# Patient Record
Sex: Male | Born: 1990
Health system: Southern US, Community
[De-identification: ages and names within clinical notes are randomized; demographics above are authoritative.]

## PROBLEM LIST (undated history)

## (undated) ENCOUNTER — Emergency Department (HOSPITAL_COMMUNITY): Payer: Self-pay | Source: Home / Self Care

## (undated) DIAGNOSIS — R569 Unspecified convulsions: Secondary | ICD-10-CM

## (undated) DIAGNOSIS — K259 Gastric ulcer, unspecified as acute or chronic, without hemorrhage or perforation: Secondary | ICD-10-CM

---
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
## 2017-08-07 ENCOUNTER — Encounter (HOSPITAL_BASED_OUTPATIENT_CLINIC_OR_DEPARTMENT_OTHER): Payer: Self-pay

## 2017-08-07 ENCOUNTER — Other Ambulatory Visit: Payer: Self-pay

## 2017-08-07 ENCOUNTER — Emergency Department (HOSPITAL_BASED_OUTPATIENT_CLINIC_OR_DEPARTMENT_OTHER)
Admission: EM | Admit: 2017-08-07 | Discharge: 2017-08-07 | Disposition: A | Discharge status: Home / Self Care | Payer: Self-pay | Attending: Emergency Medicine | Admitting: Emergency Medicine

## 2017-08-07 DIAGNOSIS — F17210 Nicotine dependence, cigarettes, uncomplicated: Secondary | ICD-10-CM | POA: Insufficient documentation

## 2017-08-07 DIAGNOSIS — L0291 Cutaneous abscess, unspecified: Secondary | ICD-10-CM

## 2017-08-07 DIAGNOSIS — K610 Anal abscess: Secondary | ICD-10-CM | POA: Insufficient documentation

## 2017-08-07 MED ORDER — TRAMADOL HCL 50 MG PO TABS
50.0000 mg | ORAL_TABLET | Freq: Once | ORAL | Status: AC
  Administered 2017-08-07: 50 mg via ORAL
  Filled 2017-08-07: qty 1

## 2017-08-07 MED ORDER — SULFAMETHOXAZOLE-TRIMETHOPRIM 800-160 MG PO TABS: 1.0000 | ORAL_TABLET | Freq: Two times a day (BID) | ORAL | 0 refills | Status: DC

## 2017-08-07 MED ORDER — DOXYCYCLINE HYCLATE 100 MG PO CAPS: 100.0000 mg | ORAL_CAPSULE | Freq: Two times a day (BID) | ORAL | 0 refills | Status: DC

## 2017-08-07 MED ORDER — LIDOCAINE-EPINEPHRINE (PF) 2 %-1:200000 IJ SOLN
10.0000 mL | Freq: Once | INTRAMUSCULAR | Status: AC
  Administered 2017-08-07: 10 mL
  Filled 2017-08-07 (×2): qty 10

## 2017-08-07 MED ORDER — TRAMADOL HCL 50 MG PO TABS: 50.0000 mg | ORAL_TABLET | Freq: Four times a day (QID) | ORAL | 0 refills | Status: DC | PRN

## 2017-08-07 NOTE — ED Triage Notes (Signed)
C/o right buttock abscess x 1 week-NAD-steady gait

## 2017-08-07 NOTE — Discharge Instructions (Signed)
Please read and follow all provided instructions.  You were seen here today for an Abscess . For this, an incision and drainage (aka an I&D) to the affected area was done today. An I&D is a surgical procedure to open and drain a fluid-filled sac that may be filled with pus, mucus, or blood. Examples of fluid-filled sacs that may need surgical drainage include cysts, skin infections (abscesses), and red lumps that develop from a ruptured cyst or a small abscess (boils).  Home instructions  1. Medications: Bactrim. Please take all of your antibiotics until finished!   You may develop abdominal discomfort or diarrhea from the antibiotic.  You may help offset this with probiotics which you can buy or get in yogurt. Do not eat or take the probiotics until 2 hours after your antibiotic. Do not take your medicine if develop an itchy rash, swelling in your mouth or lips, or difficulty breathing.   2. Treatment: Keep wound clean and dry. Apply warm compresses to the area throughout the day. It will continue to drain over the follow days.   3. For breakthrough pain you may take Ultram. Do not drink alcohol drive or operate heavy machinery when taking. You are being provided a prescription for opiates (also known as narcotics) for pain control on an ?as needed? basis.  Opiates can be addictive and should only be used when absolutely necessary for pain control when other alternatives do not work.  We recommend you only use them for the recommended amount of time and only as prescribed.  Please do not take with other sedative medications or alcohol.  Please do not drive, operate machinery, or make important decisions while taking opiates.  Please note that these medications can be addictive and have high abuse potential.  Please keep these medications locked away from children, teenagers or any family members with history of substance abuse. Additionally, these medications may cause constipation - take over the counter  stool softeners or add fiber to your diet to treat this (Metamucil, Psyllium Fiber, Colace, Miralax) Further refills will need to be obtained from your primary care doctor and will not be prescribed through the Emergency Department. You will test positive on most drug tests while taking this medication.   Follow Up:  [facility] your [facility] Provider [facility] for wound recheck. Return to emergency department for emergent changing or worsening symptoms.  Return instructions:  Return to the Emergency Department if you have: Fever You have more redness, swelling, or pain around your incision.  Your incision feels warm to touch Redness of the skin that moves away from the affected area, especially if it streaks away from the affected area  The area where the incision and drainage occurred becomes numb or it tingles. Any other emergent concerns  Additional Information: If you have recurrent abscesses, try both the following. Use a Qtip to apply an over-the-counter antibiotic to the inside of your abscess, twice a day for 5 days. Wash your body with over-the-counter Hibaclens once a day for one week and then once every two weeks. This can reduce the amount of bacterial on your skin that causes boils and lead to fewer boils. If you continue to have multiple or recurrent boils, you should see a dermatologist (skin doctor).   Your vital signs today were: BP 103/61 (BP Location: Left Arm)    Pulse 80    Temp 98.3 F (36.8 C) (Oral)    Resp 20  Ht 5\' 9"  (1.753 m)    Wt 107.1 kg (236 lb 3.2 oz)    SpO2 98%    BMI 34.88 kg/m  If your blood pressure (BP) was elevated above 135/85 this visit, please have this repeated by your doctor within one month. ---------------

## 2017-08-07 NOTE — ED Provider Notes (Signed)
[facility] EMERGENCY DEPARTMENT Provider Note   CSN: [id number] Arrival date & time: 08/07/17  1551     History   Chief Complaint Chief Complaint  Patient presents with  . Abscess    HPI Juan Mcdonald is a 27 y.o. male with no reported past medical history presents emergency department today for abscess to the right buttock x1 week.  Patient reports that he has a history of the same as required incision and drainage in the past.  He notes over the last 3 days the area has been draining a brown, white purulent discharge but stopped today.  He notes he has been trying warm soaks for this without any relief.  He describes the pain as a pressure and rates it as a 8/10.  He notes he has been taking over-the-counter medication for his symptoms with mild relief.  Patient denies any personal history of family history of IBD.  He denies history of immunosuppression including HIV, diabetes, chronic steroid use etc.  Patient denies any IV drug use.  No fever, abdominal pain, nausea or vomiting at home.  HPI  History reviewed. No pertinent past medical history.  There are no active problems to display for this patient.   History reviewed. No pertinent surgical history.      Home Medications    Prior to Admission medications   Not on File    Family History No family history on file.  Social History Social History   Tobacco Use  . Smoking status: Current Every Day Smoker  . Smokeless tobacco: Never Used  Substance Use Topics  . Alcohol use: Yes    Comment: occ  . Drug use: Never     Allergies   Aspirin and Penicillins   Review of Systems Review of Systems  All other systems reviewed and are negative.    Physical Exam Updated Vital Signs BP 103/61 (BP Location: Left Arm)   Pulse 80   Temp 98.3 F (36.8 C) (Oral)   Resp 20   Ht 5\' 9"  (1.753 m)   Wt 107.1 kg (236 lb 3.2 oz)   SpO2 98%   BMI 34.88 kg/m   Physical Exam  Constitutional: He  appears well-developed and well-nourished.  HENT:  Head: Normocephalic and atraumatic.  Right Ear: External ear normal.  Left Ear: External ear normal.  Eyes: Conjunctivae are normal. Right eye exhibits no discharge. Left eye exhibits no discharge. No scleral icterus.  Pulmonary/Chest: Effort normal. No respiratory distress.  Abdominal: He exhibits no distension. There is no tenderness. There is no rigidity, no rebound, no guarding and no CVA tenderness.  Genitourinary:  Genitourinary Comments: Chaperone present.  Patient has a 2 cm x 2 cm area of fluctuance that is adjacent to a prior incision is well-healing.  There is mild overlying erythema and heat.  Minimal induration.  This does not extend into the anus.  No involvement of the perineum.  Neurological: He is alert.  Skin: No pallor.  Psychiatric: He has a normal mood and affect.  Nursing note and vitals reviewed.    ED Treatments / Results  Labs (all labs ordered are listed, but only abnormal results are displayed) Labs Reviewed - No data to display  EKG None  Radiology No results found.  Procedures .Marland KitchenIncision and Drainage Date/Time: 08/07/2017 6:03 PM Performed by: Jacinto Halim, PA-C Authorized by: Jacinto Halim, PA-C   Consent:    Consent obtained:  Verbal   Consent given by:  Patient  Risks discussed:  Bleeding, damage to other organs, infection, incomplete drainage and pain   Alternatives discussed:  No treatment Location:    Type:  Abscess   Size:  2cm   Location:  Anogenital   Anogenital location:  Perianal Pre-procedure details:    Skin preparation:  Betadine Anesthesia (see MAR for exact dosages):    Anesthesia method:  Local infiltration   Local anesthetic:  Lidocaine 2% WITH epi Procedure type:    Complexity:  Simple Procedure details:    Needle aspiration: no     Incision types:  Stab incision   Scalpel blade:  11   Wound management:  Probed and deloculated   Drainage:  Bloody and  purulent   Drainage amount:  Moderate   Wound treatment:  Wound left open   Packing materials:  None Post-procedure details:    Patient tolerance of procedure:  Tolerated well, no immediate complications   (including critical care time)  Medications Ordered in ED Medications  lidocaine-EPINEPHrine (XYLOCAINE W/EPI) 2 %-1:200000 (PF) injection 10 mL (has no administration in time range)     Initial Impression / Assessment and Plan / ED Course  I have reviewed the triage vital signs and the nursing notes.  Pertinent labs & imaging results that were available during my care of the patient were reviewed by me and considered in my medical decision making (see chart for details).     27 y.o. male with abscess on exam.  There is moderate amount of cellulitis.  This does not appear to extend into the anus. Do not feel he needs CT scan to evaluate. No peritoneal cellulitis or abscess to make me concerned for Fournier's.  He denies history of IBD.  Patient is afebrile in the department with stable vital signs.  He denies any fever, nausea or vomiting at home.  No abdominal tenderness.  Abscess was amenable to incision and drainage.  It was not large enough to warrant packing.  Recommended sitz bath, and warm flushes.  Patient reviewed in [place] controlled substance database without any discrepancies found.  Will prescribe short course of pain medication.  Will prescribe antibiotics.  Ideally would give Augmentin however patient does have penicillin allergy.  Will give Bactrim. Patient to follow up in 2 days for wound recheck. He is to return sooner for worsening signs of cellulitis, fever, emesis, abdominal pain.  Strict return precautions discussed.  Patient appears safe for discharge.  Final Clinical Impressions(s) / ED Diagnoses   Final diagnoses:  Abscess    ED Discharge Orders    None       Princella PellegriniMaczis, Takerra Lupinacci M, PA-C 08/07/17 1814    Loren RacerYelverton, David, MD 08/08/17 [id number]

## 2017-08-07 NOTE — ED Notes (Signed)
Pt verbalizes understanding of d/c instructions and denies any further needs at this time. 

## 2017-09-16 ENCOUNTER — Emergency Department (HOSPITAL_BASED_OUTPATIENT_CLINIC_OR_DEPARTMENT_OTHER): Payer: Self-pay

## 2017-09-16 ENCOUNTER — Other Ambulatory Visit: Payer: Self-pay

## 2017-09-16 ENCOUNTER — Emergency Department (HOSPITAL_BASED_OUTPATIENT_CLINIC_OR_DEPARTMENT_OTHER)
Admission: EM | Admit: 2017-09-16 | Discharge: 2017-09-16 | Disposition: A | Discharge status: Home / Self Care | Payer: Self-pay | Attending: Emergency Medicine | Admitting: Emergency Medicine

## 2017-09-16 ENCOUNTER — Encounter (HOSPITAL_BASED_OUTPATIENT_CLINIC_OR_DEPARTMENT_OTHER): Payer: Self-pay | Admitting: *Deleted

## 2017-09-16 DIAGNOSIS — Y939 Activity, unspecified: Secondary | ICD-10-CM | POA: Insufficient documentation

## 2017-09-16 DIAGNOSIS — Z79899 Other long term (current) drug therapy: Secondary | ICD-10-CM | POA: Insufficient documentation

## 2017-09-16 DIAGNOSIS — Y929 Unspecified place or not applicable: Secondary | ICD-10-CM | POA: Insufficient documentation

## 2017-09-16 DIAGNOSIS — Y999 Unspecified external cause status: Secondary | ICD-10-CM | POA: Insufficient documentation

## 2017-09-16 DIAGNOSIS — F17200 Nicotine dependence, unspecified, uncomplicated: Secondary | ICD-10-CM | POA: Insufficient documentation

## 2017-09-16 DIAGNOSIS — W25XXXA Contact with sharp glass, initial encounter: Secondary | ICD-10-CM | POA: Insufficient documentation

## 2017-09-16 DIAGNOSIS — S61412A Laceration without foreign body of left hand, initial encounter: Secondary | ICD-10-CM | POA: Insufficient documentation

## 2017-09-16 NOTE — ED Triage Notes (Signed)
Laceration to his left hand on a broken car window. Bleeding controlled.

## 2017-09-16 NOTE — ED Notes (Signed)
Pt. Reports he fell into a car window causing it to break and lacerating his L hand.  Pt. Has controlled bleeding with 10/10 pain.

## 2017-09-16 NOTE — ED Notes (Signed)
Pt. Reports he had a tetanus shot a year ago.

## 2017-09-16 NOTE — ED Provider Notes (Signed)
[facility] EMERGENCY DEPARTMENT Provider Note   CSN: [id number] Arrival date & time: 09/16/17  1238     History   Chief Complaint Chief Complaint  Patient presents with  . Laceration    HPI Juan [place] Mcdonald is a 27 y.o. right handed male with no significant past medical history presents emergency department today for laceration to his left palm.  Patient reports that he was reaching in his car when he placed his left palm on the car window and applied pressure causing it to break. He reports that there is a small laceration at the mid lateral palm proximal to the 5th digit. He reports no interventions prior to arrival. He reports palpation makes his symptoms worse. Nothing makes them better. He denies numbness/tingling/weakness. His tetanus is up to date.   HPI  History reviewed. No pertinent past medical history.  There are no active problems to display for this patient.   History reviewed. No pertinent surgical history.      Home Medications    Prior to Admission medications   Medication Sig Start Date End Date Taking? Authorizing Provider  sulfamethoxazole-trimethoprim (BACTRIM DS,SEPTRA DS) 800-160 MG tablet Take 1 tablet by mouth 2 (two) times daily. 08/07/17   Felita Bump, Elmer SowMichael M, PA-C  traMADol (ULTRAM) 50 MG tablet Take 1 tablet (50 mg total) by mouth every 6 (six) hours as needed. 08/07/17   Zimere Dunlevy, Elmer SowMichael M, PA-C    Family History No family history on file.  Social History Social History   Tobacco Use  . Smoking status: Current Every Day Smoker  . Smokeless tobacco: Never Used  Substance Use Topics  . Alcohol use: Yes    Comment: occ  . Drug use: Never     Allergies   Aspirin and Penicillins   Review of Systems Review of Systems  Musculoskeletal: Negative for arthralgias.  Skin: Positive for wound.  Neurological: Negative for weakness and numbness.  All other systems reviewed and are negative.    Physical Exam Updated Vital  Signs BP 131/87   Pulse (!) 108   Temp 98.3 F (36.8 C) (Oral)   Resp 18   Ht 5\' 9"  (1.753 m)   Wt 108.4 kg   SpO2 98%   BMI 35.29 kg/m   Physical Exam  Constitutional: He appears well-developed and well-nourished.  HENT:  Head: Normocephalic and atraumatic.  Right Ear: External ear normal.  Left Ear: External ear normal.  Eyes: Conjunctivae are normal. Right eye exhibits no discharge. Left eye exhibits no discharge. No scleral icterus.  Cardiovascular:  Pulses:      Radial pulses are 2+ on the right side, and 2+ on the left side.  Pulmonary/Chest: Effort normal. No respiratory distress.  Musculoskeletal:       Hands: Neurological: He is alert. He has normal strength. No sensory deficit.  Skin: Skin is warm and dry. Capillary refill takes less than 2 seconds. Laceration noted. No pallor.  Psychiatric: He has a normal mood and affect.  Nursing note and vitals reviewed.    ED Treatments / Results  Labs (all labs ordered are listed, but only abnormal results are displayed) Labs Reviewed - No data to display  EKG None  Radiology Dg Hand Complete Left  Result Date: 09/16/2017 CLINICAL DATA:  Left hand pain due to an injury from punching a glass window 3 hours ago. Initial encounter. EXAM: LEFT HAND - COMPLETE 3+ VIEW COMPARISON:  None. FINDINGS: There is no evidence of fracture or dislocation. There is  no evidence of arthropathy or other focal bone abnormality. Soft tissues are unremarkable. No radiopaque foreign body is identified. IMPRESSION: Negative exam. Electronically Signed   By: Drusilla Kannerhomas  Dalessio M.D.   On: 09/16/2017 15:58    Procedures .Marland Kitchen.Laceration Repair Date/Time: 09/16/2017 4:51 PM Performed by: Jacinto HalimMaczis, Ellieana Dolecki M, PA-C Authorized by: Jacinto HalimMaczis, Keziah Avis M, PA-C   Consent:    Consent obtained:  Verbal   Consent given by:  Patient   Risks discussed:  Infection, need for additional repair, nerve damage, poor wound healing, poor cosmetic result, pain, retained  foreign body, tendon damage and vascular damage   Alternatives discussed:  No treatment Anesthesia (see MAR for exact dosages):    Anesthesia method:  None Laceration details:    Location:  Hand   Hand location:  L palm   Length (cm):  0.5 Repair type:    Repair type:  Simple Pre-procedure details:    Preparation:  Patient was prepped and draped in usual sterile fashion and imaging obtained to evaluate for foreign bodies Exploration:    Wound exploration: wound explored through full range of motion and entire depth of wound probed and visualized     Contaminated: no   Treatment:    Area cleansed with:  Shur-Clens and saline   Amount of cleaning:  Standard   Irrigation solution:  Sterile saline   Irrigation volume:  1000   Irrigation method:  Syringe   Visualized foreign bodies/material removed: no   Skin repair:    Repair method:  Tissue adhesive Approximation:    Approximation:  Close Post-procedure details:    Dressing:  Open (no dressing)   Patient tolerance of procedure:  Tolerated well, no immediate complications   (including critical care time)  Medications Ordered in ED Medications - No data to display   Initial Impression / Assessment and Plan / ED Course  I have reviewed the triage vital signs and the nursing notes.  Pertinent labs & imaging results that were available during my care of the patient were reviewed by me and considered in my medical decision making (see chart for details).     27 y.o. male with laceration to left palm.  X-ray was obtained without evidence of fracture or foreign body.  He is neurovascular intact.  There is no evidence of tendon injury.  Wound was cleansed apartment.  His tetanus is up-to-date.  Pressure irrigation performed. Wound explored and base of wound visualized in a bloodless field without evidence of foreign body.  Laceration occurred < 8 hours prior to repair which was well tolerated.  Pt has  no comorbidities to effect normal  wound healing. Pt discharged  without antibiotics.  Discussed dermabond home care with patient and answered questions. Pt to follow-up for wound check and suture removal in 7 days; they are to return to the ED sooner for signs of infection. Pt is hemodynamically stable with no complaints prior to dc.   Final Clinical Impressions(s) / ED Diagnoses   Final diagnoses:  Laceration of left hand without foreign body, initial encounter    ED Discharge Orders    None       Princella PellegriniMaczis, Martese Vanatta M, PA-C 09/16/17 1705    Tegeler, Canary Brimhristopher J, MD 09/16/17 2149

## 2017-09-16 NOTE — Discharge Instructions (Signed)
You were seen here today because of a laceration. A laceration is a cut or lesion that goes through all layers of the skin and into the tissue just beneath the skin. Your laceration has been repaired with dermabond. The film will usually remain in place for 5-10 days, then naturally fall off your skin. Keep the bandaging dry. Replace the dressing daily until the adhesive film has fallen off or if the bandage should become wet. When changing the dressing, do not apply tape directly over the dermabond adhesive film as removing the tape later may also remove the film. Do not apply topical liquids or ointments to the area while the dermabond is in place. This may loosen the film. You may occasionally breifely wet your wound in a shower or bath. Do not soak or scrub your wound. Do not swim. Avoid periods of heavy perspiration. After showering, gently blot your wound dry with a soft towel and apply new clean bandage. Protect your wound from injury. Do not scratch, rub or pick at the Dermabond film.   SEEK MEDICAL CARE IF:  You have redness, swelling, or increasing pain in the wound.  You see a red line that goes away from the wound.  You have yellowish-white fluid (pus) coming from the wound.  You have a fever.  You notice a bad smell coming from the wound or dressing.  Your wound breaks open before or after sutures have been removed.  You notice something coming out of the wound such as wood or glass.  Your wound is on your hand or foot and you cannot move a finger or toe.  Your pain is not controlled with prescribed medicine.  Additional Information:  If you did not receive a tetanus shot today because you thought you were up to date, but did not recall when your last one was given, make sure to check with your primary caregiver to determine if you need one.   Your vital signs today were: BP 119/79 (BP Location: Right Arm)    Pulse 91    Temp 98.3 F (36.8 C) (Oral)    Resp 18    Ht $RemoveBeforeD [id number]$5\' 9"29 kg/m  If your blood pressure (BP) was elevated above 135/85 this visit, please have this repeated by your doctor within one month..Marland Kitchen

## 2017-11-21 ENCOUNTER — Encounter (HOSPITAL_BASED_OUTPATIENT_CLINIC_OR_DEPARTMENT_OTHER): Payer: Self-pay | Admitting: *Deleted

## 2017-11-21 ENCOUNTER — Other Ambulatory Visit: Payer: Self-pay

## 2017-11-21 ENCOUNTER — Emergency Department (HOSPITAL_BASED_OUTPATIENT_CLINIC_OR_DEPARTMENT_OTHER)
Admission: EM | Admit: 2017-11-21 | Discharge: 2017-11-22 | Disposition: A | Discharge status: Home / Self Care | Payer: Self-pay | Attending: Emergency Medicine | Admitting: Emergency Medicine

## 2017-11-21 DIAGNOSIS — M533 Sacrococcygeal disorders, not elsewhere classified: Secondary | ICD-10-CM | POA: Insufficient documentation

## 2017-11-21 DIAGNOSIS — Z79899 Other long term (current) drug therapy: Secondary | ICD-10-CM | POA: Insufficient documentation

## 2017-11-21 DIAGNOSIS — F17210 Nicotine dependence, cigarettes, uncomplicated: Secondary | ICD-10-CM | POA: Insufficient documentation

## 2017-11-21 LAB — URINALYSIS, ROUTINE W REFLEX MICROSCOPIC
BILIRUBIN URINE: NEGATIVE
Glucose, UA: NEGATIVE mg/dL
Hgb urine dipstick: NEGATIVE
KETONES UR: NEGATIVE mg/dL
LEUKOCYTES UA: NEGATIVE
Nitrite: NEGATIVE
PH: 7 (ref 5.0–8.0)
PROTEIN: NEGATIVE mg/dL
SPECIFIC GRAVITY, URINE: 1.01 (ref 1.005–1.030)

## 2017-11-21 NOTE — ED Triage Notes (Signed)
Left flank pain with radiation down his left leg and into his groin x 2 hours.

## 2017-11-21 NOTE — ED Provider Notes (Signed)
[facility] Provider Note: Juan Dell, MD, FACEP  CSN: [id number] MRN: [id number] ARRIVAL: 11/21/17 at 2036 ROOM: [facility]   CHIEF COMPLAINT  Back Pain   HISTORY OF PRESENT ILLNESS  11/21/17 11:59 PM Jill Alexanders [place] Bell is a 27 y.o. male who developed left flank pain about 8 PM at work yesterday evening.  His work was not strenuous and he was not lifting or pushing anything heavy.  The pain is primarily located in the left sacroiliac area radiating around to his left groin.  He also feels some pain higher in his left flank and into his left thigh.  Pain is worse with movement.  He rates it as a 10 out of 10.  There is no associated numbness or weakness.  He denies urinary symptoms.   History reviewed. No pertinent past medical history.  History reviewed. No pertinent surgical history.  No family history on file.  Social History   Tobacco Use  . Smoking status: Current Every Day Smoker  . Smokeless tobacco: Never Used  Substance Use Topics  . Alcohol use: Yes    Comment: occ  . Drug use: Never    Prior to Admission medications   Medication Sig Start Date End Date Taking? Authorizing Provider  sulfamethoxazole-trimethoprim (BACTRIM DS,SEPTRA DS) 800-160 MG tablet Take 1 tablet by mouth 2 (two) times daily. 08/07/17   Maczis, Elmer Sow, PA-C  traMADol (ULTRAM) 50 MG tablet Take 1 tablet (50 mg total) by mouth every 6 (six) hours as needed. 08/07/17   Maczis, Elmer Sow, PA-C    Allergies Aspirin and Penicillins   REVIEW OF SYSTEMS  Negative except as noted here or in the History of Present Illness.   PHYSICAL EXAMINATION  Initial Vital Signs Blood pressure 127/69, pulse 73, temperature 98.2 F (36.8 C), temperature source Oral, resp. rate 18, height 5\' 9"  (1.753 m), weight 107 kg, SpO2 100 %.  Examination General: Well-developed, well-nourished male in no acute distress; appearance consistent with age of record HENT: normocephalic; atraumatic Eyes:  pupils equal, round and reactive to light; extraocular muscles intact Neck: supple Heart: regular rate and rhythm Lungs: clear to auscultation bilaterally Abdomen: soft; nondistended; nontender; bowel sounds present Back: Left SI tenderness with tenderness of the left groin; positive straight leg raise of the left Extremities: No deformity; full range of motion; pulses normal Neurologic: Awake, alert and oriented; motor function intact in all extremities and symmetric; sensation intact and symmetric in the lower extremities; no facial droop Skin: Warm and dry Psychiatric: Normal mood and affect   RESULTS  Summary of this visit's results, reviewed by myself:   EKG Interpretation  Date/Time:    Ventricular Rate:    PR Interval:    QRS Duration:   QT Interval:    QTC Calculation:   R Axis:     Text Interpretation:        Laboratory Studies: Results for orders placed or performed during the hospital encounter of 11/21/17 (from the past 24 hour(s))  Urinalysis, Routine w reflex microscopic     Status: None   Collection Time: 11/21/17  9:38 PM  Result Value Ref Range   Color, Urine YELLOW YELLOW   APPearance CLEAR CLEAR   Specific Gravity, Urine 1.010 1.005 - 1.030   pH 7.0 5.0 - 8.0   Glucose, UA NEGATIVE NEGATIVE mg/dL   Hgb urine dipstick NEGATIVE NEGATIVE   Bilirubin Urine NEGATIVE NEGATIVE   Ketones, ur NEGATIVE NEGATIVE mg/dL   Protein, ur NEGATIVE NEGATIVE mg/dL  Nitrite NEGATIVE NEGATIVE   Leukocytes, UA NEGATIVE NEGATIVE   Imaging Studies: No results found.  ED COURSE and MDM  Nursing notes and initial vitals signs, including pulse oximetry, reviewed.  Vitals:   11/21/17 2058 11/21/17 2059 11/21/17 2342  BP:  123/82 127/69  Pulse:  86 73  Resp:  18 18  Temp:  98.4 F (36.9 C) 98.2 F (36.8 C)  TempSrc:  Oral Oral  SpO2:  99% 100%  Weight: 107 kg    Height: 5\' 9"  (1.753 m)     Examination consistent with musculoskeletal pain, most likely  sacroiliitis.  Consultation with the [facility] state controlled substances database reveals the patient has received 3 prescriptions for tramadol and 2 for hydrocodone in the past 2 years.   PROCEDURES    ED DIAGNOSES     ICD-10-CM   1. Sacroiliac joint pain M53.3        Ajwa Kimberley, MD 11/22/17 [phone]

## 2017-11-22 MED ORDER — HYDROCODONE-ACETAMINOPHEN 5-325 MG PO TABS: 1.0000 | ORAL_TABLET | Freq: Four times a day (QID) | ORAL | 0 refills | Status: DC | PRN

## 2017-11-22 MED ORDER — IBUPROFEN 800 MG PO TABS: 800.0000 mg | ORAL_TABLET | Freq: Three times a day (TID) | ORAL | 0 refills | Status: DC | PRN

## 2017-11-22 MED ORDER — HYDROCODONE-ACETAMINOPHEN 5-325 MG PO TABS
1.0000 | ORAL_TABLET | Freq: Once | ORAL | Status: AC
Start: 2017-11-22 — End: 2017-11-22
  Administered 2017-11-22: 1 via ORAL
  Filled 2017-11-22: qty 1

## 2017-11-22 MED ORDER — IBUPROFEN 800 MG PO TABS
800.0000 mg | ORAL_TABLET | Freq: Once | ORAL | Status: AC
  Administered 2017-11-22: 800 mg via ORAL
  Filled 2017-11-22: qty 1

## 2018-11-21 ENCOUNTER — Emergency Department (HOSPITAL_BASED_OUTPATIENT_CLINIC_OR_DEPARTMENT_OTHER)
Admission: EM | Admit: 2018-11-21 | Discharge: 2018-11-21 | Disposition: A | Discharge status: Home / Self Care | Payer: Self-pay | Attending: Emergency Medicine | Admitting: Emergency Medicine

## 2018-11-21 ENCOUNTER — Encounter (HOSPITAL_BASED_OUTPATIENT_CLINIC_OR_DEPARTMENT_OTHER): Payer: Self-pay | Admitting: Emergency Medicine

## 2018-11-21 ENCOUNTER — Other Ambulatory Visit: Payer: Self-pay

## 2018-11-21 DIAGNOSIS — K5900 Constipation, unspecified: Secondary | ICD-10-CM | POA: Insufficient documentation

## 2018-11-21 DIAGNOSIS — F17210 Nicotine dependence, cigarettes, uncomplicated: Secondary | ICD-10-CM | POA: Insufficient documentation

## 2018-11-21 DIAGNOSIS — R110 Nausea: Secondary | ICD-10-CM | POA: Insufficient documentation

## 2018-11-21 DIAGNOSIS — R1013 Epigastric pain: Secondary | ICD-10-CM | POA: Insufficient documentation

## 2018-11-21 HISTORY — DX: Gastric ulcer, unspecified as acute or chronic, without hemorrhage or perforation: K25.9

## 2018-11-21 HISTORY — DX: Unspecified convulsions: R56.9

## 2018-11-21 LAB — COMPREHENSIVE METABOLIC PANEL
ALT: 19 U/L (ref 0–44)
AST: 18 U/L (ref 15–41)
Albumin: 3.7 g/dL (ref 3.5–5.0)
Alkaline Phosphatase: 62 U/L (ref 38–126)
Anion gap: 6 (ref 5–15)
BUN: 11 mg/dL (ref 6–20)
CO2: 29 mmol/L (ref 22–32)
Calcium: 8.9 mg/dL (ref 8.9–10.3)
Chloride: 102 mmol/L (ref 98–111)
Creatinine, Ser: 1.02 mg/dL (ref 0.61–1.24)
GFR calc Af Amer: >60 mL/min (ref >60)
GFR calc non Af Amer: >60 mL/min (ref >60)
Glucose, Bld: 87 mg/dL (ref 70–99)
Potassium: 3.6 mmol/L (ref 3.5–5.1)
Sodium: 137 mmol/L (ref 135–145)
Total Bilirubin: 0.7 mg/dL (ref 0.3–1.2)
Total Protein: 6.5 g/dL (ref 6.5–8.1)

## 2018-11-21 LAB — CBC WITH DIFFERENTIAL/PLATELET
Abs Immature Granulocytes: 0.02 10*3/uL (ref 0.00–0.07)
Basophils Absolute: 0 10*3/uL (ref 0.0–0.1)
Basophils Relative: 0 %
Eosinophils Absolute: 0.1 10*3/uL (ref 0.0–0.5)
Eosinophils Relative: 1 %
HCT: 49.3 % (ref 39.0–52.0)
Hemoglobin: 15.9 g/dL (ref 13.0–17.0)
Immature Granulocytes: 0 %
Lymphocytes Relative: 22 %
Lymphs Abs: 1.4 10*3/uL (ref 0.7–4.0)
MCH: 31.4 pg (ref 26.0–34.0)
MCHC: 32.3 g/dL (ref 30.0–36.0)
MCV: 97.4 fL (ref 80.0–100.0)
Monocytes Absolute: 0.7 10*3/uL (ref 0.1–1.0)
Monocytes Relative: 10 %
Neutro Abs: 4.3 10*3/uL (ref 1.7–7.7)
Neutrophils Relative %: 67 %
Platelets: 263 10*3/uL (ref 150–400)
RBC: 5.06 MIL/uL (ref 4.22–5.81)
RDW: 12.1 % (ref 11.5–15.5)
WBC: 6.5 10*3/uL (ref 4.0–10.5)
nRBC: 0 % (ref 0.0–0.2)

## 2018-11-21 LAB — URINALYSIS, ROUTINE W REFLEX MICROSCOPIC
Glucose, UA: NEGATIVE mg/dL
Hgb urine dipstick: NEGATIVE
Ketones, ur: 40 mg/dL — AB
Leukocytes,Ua: NEGATIVE
Nitrite: NEGATIVE
Protein, ur: NEGATIVE mg/dL
Specific Gravity, Urine: 1.02 (ref 1.005–1.030)
pH: 7 (ref 5.0–8.0)

## 2018-11-21 LAB — LIPASE, BLOOD: Lipase: 29 U/L (ref 11–51)

## 2018-11-21 MED ORDER — ALUM & MAG HYDROXIDE-SIMETH 200-200-20 MG/5ML PO SUSP
30.0000 mL | Freq: Once | ORAL | Status: AC
  Administered 2018-11-21: 13:00:00 30 mL via ORAL
  Filled 2018-11-21: qty 30

## 2018-11-21 MED ORDER — SUCRALFATE 1 G PO TABS: 1.0000 g | ORAL_TABLET | Freq: Three times a day (TID) | ORAL | 0 refills | Status: DC

## 2018-11-21 MED ORDER — PANTOPRAZOLE SODIUM 20 MG PO TBEC: 20.0000 mg | DELAYED_RELEASE_TABLET | Freq: Every day | ORAL | 1 refills | Status: DC

## 2018-11-21 MED ORDER — LIDOCAINE VISCOUS HCL 2 % MT SOLN
15.0000 mL | Freq: Once | OROMUCOSAL | Status: AC
  Administered 2018-11-21: 15 mL via ORAL
  Filled 2018-11-21: qty 15

## 2018-11-21 MED ORDER — ONDANSETRON HCL 4 MG/2ML IJ SOLN
4.0000 mg | Freq: Once | INTRAMUSCULAR | Status: AC
  Administered 2018-11-21: 13:00:00 4 mg via INTRAVENOUS
  Filled 2018-11-21: qty 2

## 2018-11-21 MED ORDER — SODIUM CHLORIDE 0.9 % IV BOLUS
1000.0000 mL | Freq: Once | INTRAVENOUS | Status: AC
  Administered 2018-11-21: 1000 mL via INTRAVENOUS

## 2018-11-21 MED ORDER — ONDANSETRON HCL 4 MG PO TABS: 4.0000 mg | ORAL_TABLET | Freq: Four times a day (QID) | ORAL | 0 refills | Status: AC

## 2018-11-21 MED FILL — ONDANSETRON HCL 4 MG TABLET: 4 | 3 days supply | Qty: 12 | Fill #0

## 2018-11-21 MED FILL — SUCRALFATE 1 GM TABLET: 1 | 14 days supply | Qty: 56 | Fill #0

## 2018-11-21 MED FILL — PANTOPRAZOLE SOD DR 20 MG T: 20 | 30 days supply | Qty: 30 | Fill #0

## 2018-11-21 NOTE — ED Triage Notes (Signed)
Pt having epigastric pain for a couple days.  Nausea but no vomiting. Also having trouble with constipation.   Has had issues with ulcers before.

## 2018-11-21 NOTE — ED Provider Notes (Addendum)
[facility]ERGENCY DEPARTMENT Provider Note   CSN: [id number] Arrival date & time: 11/21/18  1150     History   Chief Complaint Chief Complaint  Patient presents with   Abdominal Pain    HPI Juan Mcdonald is a 28 y.o. male.     The history is provided by the patient.  Abdominal Pain Pain location:  Epigastric Pain quality: aching and burning   Pain radiates to:  Does not radiate Pain severity:  Mild Onset quality:  Gradual Timing:  Intermittent Progression:  Waxing and waning Chronicity:  New Context: eating   Context: not alcohol use   Relieved by:  Nothing Worsened by:  Nothing Associated symptoms: constipation   Associated symptoms: no chest pain, no chills, no cough, no dysuria, no fever, no hematuria, no shortness of breath, no sore throat and no vomiting   Risk factors: no alcohol abuse, has not had multiple surgeries and no NSAID use     Past Medical History:  Diagnosis Date   Gastric ulcer    Seizures ([facility])     There are no active problems to display for this patient.   History reviewed. No pertinent surgical history.      Home Medications    Prior to Admission medications   Medication Sig Start Date End Date Taking? Authorizing Provider  HYDROcodone-acetaminophen (NORCO) 5-325 MG tablet Take 1 tablet by mouth every 6 (six) hours as needed for severe pain. 11/22/17   Molpus, John, MD  ibuprofen (ADVIL,MOTRIN) 800 MG tablet Take 1 tablet (800 mg total) by mouth every 8 (eight) hours as needed (for pain). 11/22/17   Molpus, John, MD  ondansetron (ZOFRAN) 4 MG tablet Take 1 tablet (4 mg total) by mouth every 6 (six) hours for 12 doses. 11/21/18 11/24/18  Zelig Gacek, DO  pantoprazole (PROTONIX) 20 MG tablet Take 1 tablet (20 mg total) by mouth daily. 11/21/18 12/21/18  Kalaya Infantino, DO  sucralfate (CARAFATE) 1 g tablet Take 1 tablet (1 g total) by mouth 4 (four) times daily -  with meals and at bedtime for 14 days. 11/21/18  12/05/18  Lennice Sites, DO    Family History No family history on file.  Social History Social History   Tobacco Use   Smoking status: Current Every Day Smoker    Packs/day: 0.50    Types: Cigarettes   Smokeless tobacco: Never Used  Substance Use Topics   Alcohol use: Yes    Comment: occ   Drug use: Never     Allergies   Aspirin and Penicillins   Review of Systems Review of Systems  Constitutional: Negative for chills and fever.  HENT: Negative for ear pain and sore throat.   Eyes: Negative for pain and visual disturbance.  Respiratory: Negative for cough and shortness of breath.   Cardiovascular: Negative for chest pain and palpitations.  Gastrointestinal: Positive for abdominal pain and constipation. Negative for vomiting.  Genitourinary: Negative for dysuria and hematuria.  Musculoskeletal: Negative for arthralgias and back pain.  Skin: Negative for color change and rash.  Neurological: Negative for seizures and syncope.  All other systems reviewed and are negative.    Physical Exam Updated Vital Signs  ED Triage Vitals  Enc Vitals Group     BP 11/21/18 1207 126/78     Pulse Rate 11/21/18 1207 84     Resp 11/21/18 1207 14     Temp 11/21/18 1207 98.5 F (36.9 C)     Temp Source 11/21/18 1207 Oral  SpO2 11/21/18 1207 100 %     Weight 11/21/18 1207 232 lb (105.2 kg)     Height 11/21/18 1207 5\' 9"  (1.753 m)     Head Circumference --      Peak Flow --      Pain Score 11/21/18 1233 8     Pain Loc --      Pain Edu? --      Excl. in GC? --     Physical Exam Vitals signs and nursing note reviewed.  Constitutional:      General: He is not in acute distress.    Appearance: He is well-developed. He is not ill-appearing.  HENT:     Head: Normocephalic and atraumatic.  Eyes:     Extraocular Movements: Extraocular movements intact.     Conjunctiva/sclera: Conjunctivae normal.     Pupils: Pupils are equal, round, and reactive to light.  Neck:      Musculoskeletal: Neck supple.  Cardiovascular:     Rate and Rhythm: Normal rate and regular rhythm.     Heart sounds: Normal heart sounds. No murmur.  Pulmonary:     Effort: Pulmonary effort is normal. No respiratory distress.     Breath sounds: Normal breath sounds.  Abdominal:     General: Abdomen is flat. Bowel sounds are normal.     Palpations: Abdomen is soft.     Tenderness: There is abdominal tenderness in the epigastric area. There is no right CVA tenderness, left CVA tenderness, guarding or rebound. Negative signs include Murphy's sign, Rovsing's sign, McBurney's sign and psoas sign.  Skin:    General: Skin is warm and dry.     Capillary Refill: Capillary refill takes less than 2 seconds.  Neurological:     General: No focal deficit present.     Mental Status: He is alert.  Psychiatric:        Mood and Affect: Mood normal.      ED Treatments / Results  Labs (all labs ordered are listed, but only abnormal results are displayed) Labs Reviewed  URINALYSIS, ROUTINE W REFLEX MICROSCOPIC - Abnormal; Notable for the following components:      Result Value   Color, Urine AMBER (*)    Bilirubin Urine SMALL (*)    Ketones, ur 40 (*)    All other components within normal limits  CBC WITH DIFFERENTIAL/PLATELET  COMPREHENSIVE METABOLIC PANEL  LIPASE, BLOOD    EKG None  Radiology No results found.  Procedures Procedures (including critical care time)  Medications Ordered in ED Medications  sodium chloride 0.9 % bolus 1,000 mL ( Intravenous Stopped 11/21/18 1346)  ondansetron (ZOFRAN) injection 4 mg (4 mg Intravenous Given 11/21/18 1242)  alum & mag hydroxide-simeth (MAALOX/MYLANTA) 200-200-20 MG/5ML suspension 30 mL (30 mLs Oral Given 11/21/18 1239)    And  lidocaine (XYLOCAINE) 2 % viscous mouth solution 15 mL (15 mLs Oral Given 11/21/18 1239)     Initial Impression / Assessment and Plan / ED Course  I have reviewed the triage vital signs and the nursing  notes.  Pertinent labs & imaging results that were available during my care of the patient were reviewed by me and considered in my medical decision making (see chart for details).  Juan Mcdonald is a 28 year old male with no significant medical history presents the ED with epigastric abdominal pain.  Has had some nausea but no vomiting.  Has had some constipation.  Thinks that he might of had a gastric ulcer in the past.  Denies any black stools or bloody stools.  No longer on any reflux medications.  Has some tenderness in the epigastric region on exam.  No signs of peritonitis.  Vital signs are reassuring.  No fever.  Will obtain lab work to evaluate for pancreatitis, gallbladder/liver pathology.  But suspect gastritis.  Will check hemoglobin.  Will give GI cocktail, fluid bolus, Zofran.  No significant anemia, electrolyte issues, no kidney injury, no leukocytosis.  Gallbladder and liver enzymes within normal limits.  Lipase normal.  Doubt pancreatitis.  Overall suspect gastritis.  Given prescription for Protonix and Carafate.  Recommend MiraLAX for constipation.  Given information to follow-up with GI and establish care at the wellness center.  Given return precautions and discharged in ED in good condition.  This chart was dictated using voice recognition software.  Despite best efforts to proofread,  errors can occur which can change the documentation meaning.    Final Clinical Impressions(s) / ED Diagnoses   Final diagnoses:  Epigastric pain    ED Discharge Orders         Ordered    pantoprazole (PROTONIX) 20 MG tablet  Daily,   Status:  Discontinued     11/21/18 1333    sucralfate (CARAFATE) 1 g tablet  3 times daily with meals & bedtime     11/21/18 1333    ondansetron (ZOFRAN) 4 MG tablet  Every 6 hours     11/21/18 1333    pantoprazole (PROTONIX) 20 MG tablet  Daily     11/21/18 1336           Virgina NorfolkCuratolo, Alicya Bena, DO 11/21/18 1334    Virgina Norfolkuratolo, Lashica Hannay, DO 11/23/18  [phone]

## 2019-12-29 ENCOUNTER — Emergency Department (HOSPITAL_BASED_OUTPATIENT_CLINIC_OR_DEPARTMENT_OTHER)
Admission: EM | Admit: 2019-12-29 | Discharge: 2019-12-29 | Disposition: A | Discharge status: Home / Self Care | Payer: Self-pay | Attending: Emergency Medicine | Admitting: Emergency Medicine

## 2019-12-29 ENCOUNTER — Encounter (HOSPITAL_BASED_OUTPATIENT_CLINIC_OR_DEPARTMENT_OTHER): Payer: Self-pay

## 2019-12-29 ENCOUNTER — Other Ambulatory Visit: Payer: Self-pay

## 2019-12-29 DIAGNOSIS — L02411 Cutaneous abscess of right axilla: Secondary | ICD-10-CM | POA: Insufficient documentation

## 2019-12-29 DIAGNOSIS — F17210 Nicotine dependence, cigarettes, uncomplicated: Secondary | ICD-10-CM | POA: Insufficient documentation

## 2019-12-29 MED ORDER — SULFAMETHOXAZOLE-TRIMETHOPRIM 800-160 MG PO TABS: 1.0000 | ORAL_TABLET | Freq: Two times a day (BID) | ORAL | 0 refills | Status: AC

## 2019-12-29 MED ORDER — LIDOCAINE-EPINEPHRINE (PF) 1 %-1:200000 IJ SOLN
INTRAMUSCULAR | Status: AC
  Filled 2019-12-29: qty 30

## 2019-12-29 MED ORDER — LIDOCAINE-EPINEPHRINE 2 %-1:100000 IJ SOLN
1.7000 mL | Freq: Once | INTRAMUSCULAR | Status: DC
  Filled 2019-12-29: qty 1.7

## 2019-12-29 NOTE — ED Triage Notes (Signed)
Pt arrives with boil under right arm, area is swollen and not draining.

## 2019-12-29 NOTE — Discharge Instructions (Addendum)
Mr. Juan Mcdonald, it was a pleasure taking care of you in the ED. For the abscess in your armpit, the best treatment is I&D plus antibiotics but since you want to avoid the I&D, we are sending you home with some antibiotics for your abscess. Makes sure to apply some warm compressions under your armpit daily and take the antibiotics as prescribed. Return to the ED if it does not get better after the antibiotics. You can take some OTC Tylenol and ibuprofen for the pain.

## 2019-12-29 NOTE — ED Notes (Signed)
Per resident pt refusing I and D

## 2019-12-29 NOTE — ED Provider Notes (Signed)
[facility] EMERGENCY DEPARTMENT Provider Note   CSN: [id number] Arrival date & time: 12/29/19  4098     History Chief Complaint  Patient presents with  . Abscess    Juan Mcdonald is a 29 y.o. male with PMH of recurrent abscesses and gastric ulcers who presents today for evaluation a cutaneous abscess under the right axilla. Patient states she has had this abscess for about a week but has gotten bigger in the last 2 days. Patient report associated pain with movement of the right arm.  States he get his abscesses when he sweats. States he has not been able to find a nonchemical nonstick deodorant. In the past, he has tried warm compressions which helps with the pressure under his armpit. Patient denies fever, chills, anorexia.   Of note, patient has been been seen in the ED multiple times for similar abscess. He refused I&D at one visit and agreed to have I&D at another visit. Patient states as long as he takes the antibiotics, the abscess drain on their own and gets better.    HPI     Past Medical History:  Diagnosis Date  . Gastric ulcer   . Seizures ([facility])     There are no problems to display for this patient.   History reviewed. No pertinent surgical history.     No family history on file.  Social History   Tobacco Use  . Smoking status: Current Every Day Smoker    Packs/day: 0.50    Types: Cigarettes  . Smokeless tobacco: Never Used  Substance Use Topics  . Alcohol use: Yes    Comment: occ  . Drug use: Never    Home Medications Prior to Admission medications   Medication Sig Start Date End Date Taking? Authorizing Provider  HYDROcodone-acetaminophen (NORCO) 5-325 MG tablet Take 1 tablet by mouth every 6 (six) hours as needed for severe pain. 11/22/17   Molpus, John, MD  ibuprofen (ADVIL,MOTRIN) 800 MG tablet Take 1 tablet (800 mg total) by mouth every 8 (eight) hours as needed (for pain). 11/22/17   Molpus, John, MD  pantoprazole (PROTONIX)  20 MG tablet Take 1 tablet (20 mg total) by mouth daily. 11/21/18 12/21/18  Curatolo, Adam, DO  sucralfate (CARAFATE) 1 g tablet Take 1 tablet (1 g total) by mouth 4 (four) times daily -  with meals and at bedtime for 14 days. 11/21/18 12/05/18  Virgina Norfolk, DO    Allergies    Aspirin and Penicillins  Review of Systems   Review of Systems  Constitutional: Negative for chills and fever.  Respiratory: Negative for cough and shortness of breath.   Cardiovascular: Negative for chest pain.  Gastrointestinal: Negative for abdominal pain.  Genitourinary: Negative for dysuria.  Skin:       Abscess under right armpit  Neurological: Negative for weakness and headaches.  Psychiatric/Behavioral: Negative for confusion.    Physical Exam Updated Vital Signs BP (!) 135/93 (BP Location: Right Arm)   Pulse 83   Temp 98 F (36.7 C) (Oral)   Resp 18   Ht 5\' 9"  (1.753 m)   Wt 103 kg   SpO2 98%   BMI 33.52 kg/m   Physical Exam Constitutional:      Appearance: Normal appearance.  HENT:     Head: Normocephalic and atraumatic.     Nose: Nose normal.  Eyes:     Conjunctiva/sclera: Conjunctivae normal.  Cardiovascular:     Rate and Rhythm: Normal rate and regular rhythm.  Pulses: Normal pulses.     Heart sounds: Normal heart sounds.  Pulmonary:     Effort: Pulmonary effort is normal.     Breath sounds: Normal breath sounds.  Abdominal:     General: Bowel sounds are normal.     Palpations: Abdomen is soft.  Musculoskeletal:        General: Normal range of motion.     Cervical back: Normal range of motion.  Skin:    Findings: Abscess present.       Neurological:     General: No focal deficit present.     Mental Status: He is alert and oriented to person, place, and time.  Psychiatric:        Mood and Affect: Mood normal.     ED Results / Procedures / Treatments   Labs (all labs ordered are listed, but only abnormal results are displayed) Labs Reviewed - No data to  display  EKG None  Radiology No results found.  Procedures Procedures (including critical care time)  Medications Ordered in ED Medications - No data to display  ED Course  I have reviewed the triage vital signs and the nursing notes.  Pertinent labs & imaging results that were available during my care of the patient were reviewed by me and considered in my medical decision making (see chart for details).    MDM Rules/Calculators/A&P                          29 year old patient with a history of recurrent abscesses here for an evaluation of a cutaneous abscess on the right axilla x1 week.  Denies fever or chills.  Previous abscesses treated with antibiotics plus or minus I&D. Patient found to have an intubated, warm and tender abscess in the right axilla. Patient refused I&D today and prefers to do antibiotics with warm compression. Discharged home on Bactrim for 10 days with instructions to try warm compressions and take over-the-counter ibuprofen and Tylenol for pain. Return precautions given.  Final Clinical Impression(s) / ED Diagnoses Final diagnoses:  Abscess of axilla, right    Rx / DC Orders ED Discharge Orders    None       Steffanie Rainwater, MD 12/29/19 8299    Cathren Laine, MD 12/29/19 1014

## 2020-02-10 ENCOUNTER — Encounter (HOSPITAL_BASED_OUTPATIENT_CLINIC_OR_DEPARTMENT_OTHER): Payer: Self-pay

## 2020-02-10 ENCOUNTER — Other Ambulatory Visit (HOSPITAL_BASED_OUTPATIENT_CLINIC_OR_DEPARTMENT_OTHER): Payer: Self-pay | Admitting: Emergency Medicine

## 2020-02-10 ENCOUNTER — Other Ambulatory Visit: Payer: Self-pay

## 2020-02-10 ENCOUNTER — Emergency Department (HOSPITAL_BASED_OUTPATIENT_CLINIC_OR_DEPARTMENT_OTHER)
Admission: EM | Admit: 2020-02-10 | Discharge: 2020-02-10 | Disposition: A | Discharge status: Home / Self Care | Payer: Self-pay | Attending: Emergency Medicine | Admitting: Emergency Medicine

## 2020-02-10 DIAGNOSIS — F17210 Nicotine dependence, cigarettes, uncomplicated: Secondary | ICD-10-CM | POA: Insufficient documentation

## 2020-02-10 DIAGNOSIS — K0889 Other specified disorders of teeth and supporting structures: Secondary | ICD-10-CM

## 2020-02-10 DIAGNOSIS — R1013 Epigastric pain: Secondary | ICD-10-CM | POA: Insufficient documentation

## 2020-02-10 DIAGNOSIS — T391X1A Poisoning by 4-Aminophenol derivatives, accidental (unintentional), initial encounter: Secondary | ICD-10-CM | POA: Insufficient documentation

## 2020-02-10 LAB — PROTIME-INR
INR: 1 (ref 0.8–1.2)
Prothrombin Time: 12.3 seconds (ref 11.4–15.2)

## 2020-02-10 LAB — COMPREHENSIVE METABOLIC PANEL
ALT: 29 U/L (ref 0–44)
AST: 25 U/L (ref 15–41)
Albumin: 4.6 g/dL (ref 3.5–5.0)
Alkaline Phosphatase: 77 U/L (ref 38–126)
Anion gap: 10 (ref 5–15)
BUN: 11 mg/dL (ref 6–20)
CO2: 26 mmol/L (ref 22–32)
Calcium: 9.8 mg/dL (ref 8.9–10.3)
Chloride: 101 mmol/L (ref 98–111)
Creatinine, Ser: 1.17 mg/dL (ref 0.61–1.24)
GFR, Estimated: >60 mL/min (ref >60)
Glucose, Bld: 95 mg/dL (ref 70–99)
Potassium: 3.4 mmol/L — ABNORMAL LOW (ref 3.5–5.1)
Sodium: 137 mmol/L (ref 135–145)
Total Bilirubin: 0.7 mg/dL (ref 0.3–1.2)
Total Protein: 8 g/dL (ref 6.5–8.1)

## 2020-02-10 LAB — CBC WITH DIFFERENTIAL/PLATELET
Abs Immature Granulocytes: 0.02 10*3/uL (ref 0.00–0.07)
Basophils Absolute: 0 10*3/uL (ref 0.0–0.1)
Basophils Relative: 0 %
Eosinophils Absolute: 0.1 10*3/uL (ref 0.0–0.5)
Eosinophils Relative: 1 %
HCT: 45.4 % (ref 39.0–52.0)
Hemoglobin: 15 g/dL (ref 13.0–17.0)
Immature Granulocytes: 0 %
Lymphocytes Relative: 29 %
Lymphs Abs: 1.9 10*3/uL (ref 0.7–4.0)
MCH: 31.3 pg (ref 26.0–34.0)
MCHC: 33 g/dL (ref 30.0–36.0)
MCV: 94.6 fL (ref 80.0–100.0)
Monocytes Absolute: 0.7 10*3/uL (ref 0.1–1.0)
Monocytes Relative: 11 %
Neutro Abs: 3.7 10*3/uL (ref 1.7–7.7)
Neutrophils Relative %: 59 %
Platelets: 341 10*3/uL (ref 150–400)
RBC: 4.8 MIL/uL (ref 4.22–5.81)
RDW: 13.3 % (ref 11.5–15.5)
WBC: 6.4 10*3/uL (ref 4.0–10.5)
nRBC: 0 % (ref 0.0–0.2)

## 2020-02-10 LAB — MAGNESIUM: Magnesium: 2 mg/dL (ref 1.7–2.4)

## 2020-02-10 LAB — ACETAMINOPHEN LEVEL: Acetaminophen (Tylenol), Serum: <10 ug/mL — ABNORMAL LOW (ref 10–30)

## 2020-02-10 LAB — LIPASE, BLOOD: Lipase: 40 U/L (ref 11–51)

## 2020-02-10 MED ORDER — FAMOTIDINE 20 MG PO TABS: 20.0000 mg | ORAL_TABLET | Freq: Two times a day (BID) | ORAL | 0 refills | Status: DC

## 2020-02-10 MED ORDER — SUCRALFATE 1 G PO TABS
1.0000 g | ORAL_TABLET | Freq: Three times a day (TID) | ORAL | 0 refills | Status: DC
Start: 2020-02-10 — End: 2020-02-10

## 2020-02-10 MED ORDER — IBUPROFEN 600 MG PO TABS: 600.0000 mg | ORAL_TABLET | Freq: Three times a day (TID) | ORAL | 0 refills | Status: DC | PRN

## 2020-02-10 MED FILL — FAMOTIDINE 20 MG TABS: 20 | 15 days supply | Qty: 30 | Fill #0

## 2020-02-10 MED FILL — SUCRALFATE 1 GM TABLET: 1 | 7 days supply | Qty: 30 | Fill #0

## 2020-02-10 MED FILL — IBUPROFEN 600 MG TABLET: 600 | 5 days supply | Qty: 15 | Fill #0

## 2020-02-10 NOTE — ED Triage Notes (Signed)
Pt c/o right upper/lower dental pain x 2-3 days-states he feels his stomach is now hurting from taking "too much tylenol" for dental pain-NAD/talking on cell phone during triage-steady gait

## 2020-02-10 NOTE — ED Provider Notes (Signed)
[facility] EMERGENCY DEPARTMENT Provider Note   CSN: [id number] Arrival date & time: 02/10/20  1141     History Chief Complaint  Patient presents with  . Dental Pain  . Abdominal Pain    Juan Mcdonald is a 30 y.o. male.  The history is provided by the patient and medical records.   Juan Mcdonald is a 30 y.o. male who presents to the Emergency Department complaining of dental pain and possible tylenol overdose. Upper and lower right molars have been hurting for a long time, worse over the last few days.  Pain radiates throughout right face. He is scheduled to see dentistry tomorrow. Denies fevers.  Due to the pain he has been taking tylenol. He bought a bottle of tylenol yesterday (500mg  tablets).  He took 12 yesterday afternoon, three at a time any time he felt pain in his face. Today he took three at a time twice.  Later he developed epigastric abdominal pain, loose stools and one episode of emesis.    Drinks occasional alcohol      Past Medical History:  Diagnosis Date  . Gastric ulcer   . Seizures ([facility])     There are no problems to display for this patient.   History reviewed. No pertinent surgical history.     No family history on file.  Social History   Tobacco Use  . Smoking status: Current Every Day Smoker    Packs/day: 0.50    Types: Cigarettes  . Smokeless tobacco: Never Used  Vaping Use  . Vaping Use: Never used  Substance Use Topics  . Alcohol use: Not Currently  . Drug use: Never    Home Medications Prior to Admission medications   Medication Sig Start Date End Date Taking? Authorizing Provider  famotidine (PEPCID) 20 MG tablet Take 1 tablet (20 mg total) by mouth 2 (two) times daily. 02/10/20  Yes 04/09/20, MD  ibuprofen (ADVIL) 600 MG tablet Take 1 tablet (600 mg total) by mouth every 8 (eight) hours as needed. 02/10/20  Yes 04/09/20, MD  sucralfate (CARAFATE) 1 g tablet Take 1 tablet (1 g total) by mouth 4 (four)  times daily -  with meals and at bedtime. 02/10/20  Yes 04/09/20, MD    Allergies    Aspirin and Penicillins  Review of Systems   Review of Systems  All other systems reviewed and are negative.   Physical Exam Updated Vital Signs BP 97/63   Pulse 72   Temp 98.1 F (36.7 C) (Oral)   Resp 20   Ht 5\' 9"  (1.753 m)   Wt 98 kg   SpO2 98%   BMI 31.90 kg/m   Physical Exam Vitals and nursing note reviewed.  Constitutional:      Appearance: He is well-developed and well-nourished.  HENT:     Head: Normocephalic and atraumatic.     Comments: Tooth 1 and 32 fractured at the gum line. No significant edema or erythema in the mouth.  Cardiovascular:     Rate and Rhythm: Normal rate and regular rhythm.     Heart sounds: No murmur heard.   Pulmonary:     Effort: Pulmonary effort is normal. No respiratory distress.     Breath sounds: Normal breath sounds.  Abdominal:     Palpations: Abdomen is soft.     Tenderness: There is abdominal tenderness. There is no guarding or rebound.     Comments: Mild epigastric tenderness  Musculoskeletal:  General: No tenderness or edema.     Cervical back: Neck supple.  Lymphadenopathy:     Cervical: No cervical adenopathy.  Skin:    General: Skin is warm and dry.  Neurological:     Mental Status: He is alert and oriented to person, place, and time.  Psychiatric:        Mood and Affect: Mood and affect normal.        Behavior: Behavior normal.     ED Results / Procedures / Treatments   Labs (all labs ordered are listed, but only abnormal results are displayed) Labs Reviewed  COMPREHENSIVE METABOLIC PANEL - Abnormal; Notable for the following components:      Result Value   Potassium 3.4 (*)    All other components within normal limits  ACETAMINOPHEN LEVEL - Abnormal; Notable for the following components:   Acetaminophen (Tylenol), Serum <10 (*)    All other components within normal limits  MAGNESIUM  CBC WITH  DIFFERENTIAL/PLATELET  LIPASE, BLOOD  PROTIME-INR    EKG EKG Interpretation  Date/Time:  Wednesday February 10 2020 16:09:02 EST Ventricular Rate:  65 PR Interval:    QRS Duration: 109 QT Interval:  396 QTC Calculation: 412 R Axis:   80 Text Interpretation: Sinus rhythm Confirmed by Tilden Fossa [phone]) on 02/10/2020 5:07:17 PM   Radiology No results found.  Procedures Procedures (including critical care time)  Medications Ordered in ED Medications - No data to display  ED Course  I have reviewed the triage vital signs and the nursing notes.  Pertinent labs & imaging results that were available during my care of the patient were reviewed by me and considered in my medical decision making (see chart for details).    MDM Rules/Calculators/A&P                         patient here for evaluation of dental pain as well is epigastric pain after taking too much Tylenol. Poison control consulted regarding Tylenol ingestion, is recommended to draw labs as well as acetaminophen level. He will not require treatment of his acetaminophen level is less than 150.  Labs obtained an acetaminophen level is less than 10, no acute liver injury. Discussed with patient home care for dental pain. Discussed proper use of over-the-counter medications. Do not recommend that he uses acetaminophen for the next several days. Recommend ibuprofen, will write prescription with H2 blocker for stomach detection. In terms of his dental pain, no evidence of acute infectious process at this time. Exam is consistent with fractured teeth and carries. 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 Return precautions discussed.  Final Clinical Impression(s) / ED Diagnoses Final diagnoses:  Pain, dental  Accidental acetaminophen overdose, initial encounter    Rx / DC Orders ED Discharge Orders         Ordered    ibuprofen (ADVIL) 600 MG tablet  Every 8 hours PRN        02/10/20 1711     famotidine (PEPCID) 20 MG tablet  2 times daily        02/10/20 1711    sucralfate (CARAFATE) 1 g tablet  3 times daily with meals & bedtime        02/10/20 1712           Tilden Fossa, MD 02/10/20 2320

## 2020-07-14 ENCOUNTER — Encounter (HOSPITAL_BASED_OUTPATIENT_CLINIC_OR_DEPARTMENT_OTHER): Payer: Self-pay | Admitting: *Deleted

## 2020-07-14 ENCOUNTER — Other Ambulatory Visit: Payer: Self-pay

## 2020-07-14 ENCOUNTER — Emergency Department (HOSPITAL_BASED_OUTPATIENT_CLINIC_OR_DEPARTMENT_OTHER)
Admission: EM | Admit: 2020-07-14 | Discharge: 2020-07-14 | Disposition: A | Discharge status: Home / Self Care | Payer: BC Managed Care – PPO | Attending: Emergency Medicine | Admitting: Emergency Medicine

## 2020-07-14 ENCOUNTER — Emergency Department (HOSPITAL_BASED_OUTPATIENT_CLINIC_OR_DEPARTMENT_OTHER): Payer: BC Managed Care – PPO

## 2020-07-14 DIAGNOSIS — M25572 Pain in left ankle and joints of left foot: Secondary | ICD-10-CM | POA: Diagnosis not present

## 2020-07-14 DIAGNOSIS — F17210 Nicotine dependence, cigarettes, uncomplicated: Secondary | ICD-10-CM | POA: Insufficient documentation

## 2020-07-14 DIAGNOSIS — M79672 Pain in left foot: Secondary | ICD-10-CM

## 2020-07-14 MED ORDER — IBUPROFEN 800 MG PO TABS
800.0000 mg | ORAL_TABLET | Freq: Once | ORAL | Status: AC
  Administered 2020-07-14: 08:00:00 800 mg via ORAL
  Filled 2020-07-14: qty 1

## 2020-07-14 NOTE — ED Triage Notes (Signed)
Having left foot pain, states pain begins at base of foot and radiates to lateral aspect of ankle area. Denies any trauma, injury, falls etc

## 2020-07-14 NOTE — ED Provider Notes (Signed)
[facility] EMERGENCY DEPARTMENT Provider Note   CSN: [id number] Arrival date & time: 07/14/20  2130     History Chief Complaint  Patient presents with   Foot Pain    Juan Mcdonald is a 30 y.o. male.  Patient presents to the emergency department complaining of left ankle pain.  He reports that many years ago he broke his ankle but is unsure if this was related.  Yesterday he noted mild to moderate pain with ambulation in his left foot/ankle.  He woke up this morning and was unable to put weight on his left foot.  He reports it feels more swollen than the right and also feels warmer than the right.  Reports that this is never occurred before.  Says that he regularly drinks alcohol and has not had any recent binges.  He has not had any shellfish recently.  No known STIs or concerns for STIs.  Patient has not taken any medications or treatments for this ankle pain, he came straight to the emergency department.      Past Medical History:  Diagnosis Date   Gastric ulcer    Seizures (HCC)     There are no problems to display for this patient.   History reviewed. No pertinent surgical history.     History reviewed. No pertinent family history.  Social History   Tobacco Use   Smoking status: Every Day    Packs/day: 0.50    Pack years: 0.00    Types: Cigarettes   Smokeless tobacco: Never  Vaping Use   Vaping Use: Never used  Substance Use Topics   Alcohol use: Not Currently   Drug use: Never    Home Medications Prior to Admission medications   Medication Sig Start Date End Date Taking? Authorizing Provider  famotidine (PEPCID) 20 MG tablet TAKE 1 TABLET BY MOUTH TWICE DAILY 02/10/20 02/09/21  Tilden Fossa, MD  ibuprofen (ADVIL) 600 MG tablet TAKE 1 TABLET BY MOUTH EVERY 8 HOURS AS NEEDED 02/10/20 02/09/21  Tilden Fossa, MD  sucralfate (CARAFATE) 1 g tablet TAKE 1 TABLET BY MOUTH 4 TIMES DAILY WITH MEALS AND AT BEDTIME 02/10/20 02/09/21  Tilden Fossa, MD     Allergies    Aspirin and Penicillins  Review of Systems   Review of Systems  Constitutional:  Negative for appetite change, chills and fever.  HENT:  Negative for congestion and rhinorrhea.   Eyes:  Negative for visual disturbance.  Respiratory:  Negative for cough and shortness of breath.   Cardiovascular:  Negative for chest pain.  Gastrointestinal:  Negative for abdominal pain, constipation, diarrhea and vomiting.  Genitourinary:  Negative for decreased urine volume, dysuria and penile discharge.  Musculoskeletal:  Positive for gait problem (Due to left foot pain).  Neurological:  Negative for headaches.   Physical Exam Updated Vital Signs BP 140/89 (BP Location: Right Arm)   Pulse 90   Temp 98.5 F (36.9 C) (Oral)   Resp 16   Ht 5\' 9"  (1.753 m)   Wt 90.7 kg   SpO2 100%   BMI 29.53 kg/m   Physical Exam Vitals reviewed.  Constitutional:      Appearance: Normal appearance.  HENT:     Head: Normocephalic and atraumatic.     Nose: Nose normal.  Eyes:     Extraocular Movements: Extraocular movements intact.  Cardiovascular:     Rate and Rhythm: Normal rate and regular rhythm.  Pulmonary:     Effort: Pulmonary effort is normal.  Breath sounds: Normal breath sounds.  Abdominal:     General: Bowel sounds are normal.  Musculoskeletal:        General: Tenderness (Patient has warmth and tenderness to palpation just distal to the left lateral malleolus.  Pain is present between the malleolus and the calcaneus.  No identifiable lesions or puncture wounds.  No significant swelling.) present. No swelling.  Skin:    General: Skin is warm and dry.     Capillary Refill: Capillary refill takes less than 2 seconds.  Neurological:     Mental Status: He is alert.    ED Results / Procedures / Treatments   Labs (all labs ordered are listed, but only abnormal results are displayed) Labs Reviewed - No data to display  EKG None  Radiology DG Foot Complete Left  Result  Date: 07/14/2020 CLINICAL DATA:  Pain EXAM: LEFT FOOT - COMPLETE 3+ VIEW COMPARISON:  None. FINDINGS: Frontal, oblique, and lateral views were obtained. No fracture or dislocation. Joint spaces appear normal. No erosive change. IMPRESSION: No fracture or dislocation.  No evident arthropathy. Electronically Signed   By: Bretta Bang III M.D.   On: 07/14/2020 08:36    Procedures Procedures   Medications Ordered in ED Medications  ibuprofen (ADVIL) tablet 800 mg (800 mg Oral Given 07/14/20 0827)    ED Course  I have reviewed the triage vital signs and the nursing notes.  Pertinent labs & imaging results that were available during my care of the patient were reviewed by me and considered in my medical decision making (see chart for details).    MDM Rules/Calculators/A&P                          30 year old male presenting to the emergency department for worsening left ankle pain.  Woke up this morning was unable to ambulate on the left foot due to pain.  He reports that he feels mild swelling although no noticeable swelling on exam.  There is mild amount of increased warmth and significant tenderness just distal to the lateral malleolus of the left foot.  No history of trauma.  No visible puncture wounds.  X-ray of left foot showed no fracture or dislocation.  No evident arthropathy.  Have high suspicion this is inflammation and not infection.  No major signs of infection or cellulitis.  No wounds noted.  Full range of motion in the ankle.  Patient was given 800 mg ibuprofen and discharged home with crutches and strict return precautions.  It was recommended that he follow-up with sports medicine.  Patient is agreeable to this.  Final Clinical Impression(s) / ED Diagnoses Final diagnoses:  Foot pain, left    Rx / DC Orders ED Discharge Orders     None        Derrel Nip, MD 07/14/20 2761    Gwyneth Sprout, MD 07/15/20 1601

## 2020-07-14 NOTE — ED Notes (Signed)
States is unable to place wt on left foot due to pain, capillary refill of left foot WNL, 2 plus pedal pulse noted, warm to touch

## 2020-07-28 ENCOUNTER — Encounter (HOSPITAL_BASED_OUTPATIENT_CLINIC_OR_DEPARTMENT_OTHER): Payer: Self-pay | Admitting: Emergency Medicine

## 2020-07-28 ENCOUNTER — Other Ambulatory Visit: Payer: Self-pay

## 2020-07-28 ENCOUNTER — Emergency Department (HOSPITAL_BASED_OUTPATIENT_CLINIC_OR_DEPARTMENT_OTHER)
Admission: EM | Admit: 2020-07-28 | Discharge: 2020-07-28 | Disposition: A | Discharge status: Home / Self Care | Payer: BC Managed Care – PPO | Attending: Emergency Medicine | Admitting: Emergency Medicine

## 2020-07-28 ENCOUNTER — Other Ambulatory Visit (HOSPITAL_BASED_OUTPATIENT_CLINIC_OR_DEPARTMENT_OTHER): Payer: Self-pay

## 2020-07-28 DIAGNOSIS — F17210 Nicotine dependence, cigarettes, uncomplicated: Secondary | ICD-10-CM | POA: Diagnosis not present

## 2020-07-28 DIAGNOSIS — K0889 Other specified disorders of teeth and supporting structures: Secondary | ICD-10-CM | POA: Diagnosis not present

## 2020-07-28 MED ORDER — CLINDAMYCIN HCL 300 MG PO CAPS: 300.0000 mg | ORAL_CAPSULE | Freq: Three times a day (TID) | ORAL | 0 refills | Status: DC

## 2020-07-28 MED ORDER — CLINDAMYCIN HCL 300 MG PO CAPS
300.0000 mg | ORAL_CAPSULE | Freq: Three times a day (TID) | ORAL | 0 refills | Status: AC
  Filled 2020-07-28: qty 21, 7d supply, fill #0

## 2020-07-28 MED ORDER — ACETAMINOPHEN 325 MG PO TABS
650.0000 mg | ORAL_TABLET | Freq: Four times a day (QID) | ORAL | 0 refills | Status: DC | PRN
  Filled 2020-07-28: qty 100, 13d supply, fill #0

## 2020-07-28 MED ORDER — ACETAMINOPHEN 325 MG PO TABS: 650.0000 mg | ORAL_TABLET | Freq: Four times a day (QID) | ORAL | 0 refills | Status: DC | PRN

## 2020-07-28 NOTE — ED Provider Notes (Addendum)
[facility] EMERGENCY DEPARTMENT Provider Note   CSN: [id number] Arrival date & time: 07/28/20  0932     History Chief Complaint  Patient presents with   Dental Pain    Juan Mcdonald is a 30 y.o. male.  HPI  30 year old male with a history of gastric ulcer, seizures, who presents to the emergency department today for evaluation of dental pain.  Reports that he has pain to the right upper molar.  Pain has been present for the last day.  Pain is constant and severe in nature.  There have been no associated fevers or difficulty opening mouth.  He does not have a Education officer, community.  Past Medical History:  Diagnosis Date   Gastric ulcer    Seizures (HCC)     There are no problems to display for this patient.   No past surgical history on file.     No family history on file.  Social History   Tobacco Use   Smoking status: Every Day    Packs/day: 0.50    Pack years: 0.00    Types: Cigarettes   Smokeless tobacco: Never  Vaping Use   Vaping Use: Never used  Substance Use Topics   Alcohol use: Not Currently   Drug use: Yes    Types: Marijuana    Home Medications Prior to Admission medications   Medication Sig Start Date End Date Taking? Authorizing Provider  acetaminophen (TYLENOL) 325 MG tablet Take 2 tablets (650 mg total) by mouth every 6 (six) hours as needed. Do not take more than 4000mg  of tylenol per day 07/28/20  Yes Alta Goding S, PA-C  clindamycin (CLEOCIN) 300 MG capsule Take 1 capsule (300 mg total) by mouth 3 (three) times daily for 7 days. 07/28/20 08/04/20 Yes Ronell Boldin S, PA-C  famotidine (PEPCID) 20 MG tablet TAKE 1 TABLET BY MOUTH TWICE DAILY 02/10/20 02/09/21  04/09/21, MD  ibuprofen (ADVIL) 600 MG tablet TAKE 1 TABLET BY MOUTH EVERY 8 HOURS AS NEEDED 02/10/20 02/09/21  04/09/21, MD  sucralfate (CARAFATE) 1 g tablet TAKE 1 TABLET BY MOUTH 4 TIMES DAILY WITH MEALS AND AT BEDTIME 02/10/20 02/09/21  04/09/21, MD    Allergies     Aspirin and Penicillins  Review of Systems   Review of Systems  Constitutional:  Negative for fever.  HENT:  Positive for dental problem. Negative for trouble swallowing.    Physical Exam Updated Vital Signs BP (!) 124/93   Pulse 79   Temp 98.4 F (36.9 C)   Resp 16   Ht 5\' 9"  (1.753 m)   Wt 88.5 kg   SpO2 100%   BMI 28.80 kg/m   Physical Exam Constitutional:      General: He is not in acute distress.    Appearance: He is well-developed.  HENT:     Mouth/Throat:      Comments: Wisdom teeth are also growing in to the upper and lower mouth on the right side.  Eyes:     Conjunctiva/sclera: Conjunctivae normal.  Cardiovascular:     Rate and Rhythm: Normal rate.  Pulmonary:     Effort: Pulmonary effort is normal.  Skin:    General: Skin is warm and dry.  Neurological:     Mental Status: He is alert and oriented to person, place, and time.    ED Results / Procedures / Treatments   Labs (all labs ordered are listed, but only abnormal results are displayed) Labs Reviewed - No data to  display  EKG None  Radiology No results found.  Procedures Procedures   Medications Ordered in ED Medications - No data to display  ED Course  I have reviewed the triage vital signs and the nursing notes.  Pertinent labs & imaging results that were available during my care of the patient were reviewed by me and considered in my medical decision making (see chart for details).    MDM Rules/Calculators/A&P                          Patient with toothache.  No gross abscess.  Exam unconcerning for Ludwig's angina or spread of infection.  Will treat with clindamycin and pain medicine.  Urged patient to follow-up with dentist.     Final Clinical Impression(s) / ED Diagnoses Final diagnoses:  Pain, dental    Rx / DC Orders ED Discharge Orders          Ordered    clindamycin (CLEOCIN) 300 MG capsule  3 times daily        07/28/20 1146    acetaminophen (TYLENOL) 325 MG  tablet  Every 6 hours PRN        07/28/20 [address] Bridney Guadarrama S, PA-C 07/28/20 1146    Elsey Holts S, PA-C 07/28/20 1150    Tegeler, Canary Brim, MD 07/28/20 1241

## 2020-07-28 NOTE — Discharge Instructions (Addendum)
You were given a prescription for antibiotics. Please take the antibiotic prescription fully.   Please follow-up with a dentist in the next 5 to 7 days for reevaluation.  If you do not have a dentist, resources were provided for dentist in the area in your discharge summary.  Please contact one of the offices that are listed and make an appointment for follow-up.  Please return to the emergency department for any new or worsening symptoms.  

## 2020-07-28 NOTE — ED Notes (Signed)
Dental pain right lower side of jaw

## 2020-07-28 NOTE — ED Triage Notes (Signed)
Right sided upper and lower dental pain since this am.  History of same

## 2020-07-29 ENCOUNTER — Other Ambulatory Visit (HOSPITAL_BASED_OUTPATIENT_CLINIC_OR_DEPARTMENT_OTHER): Payer: Self-pay

## 2021-03-07 ENCOUNTER — Emergency Department (HOSPITAL_BASED_OUTPATIENT_CLINIC_OR_DEPARTMENT_OTHER)
Admission: EM | Admit: 2021-03-07 | Discharge: 2021-03-07 | Disposition: A | Discharge status: Home / Self Care | Payer: BC Managed Care – PPO | Attending: Emergency Medicine | Admitting: Emergency Medicine

## 2021-03-07 ENCOUNTER — Encounter (HOSPITAL_BASED_OUTPATIENT_CLINIC_OR_DEPARTMENT_OTHER): Payer: Self-pay | Admitting: *Deleted

## 2021-03-07 ENCOUNTER — Other Ambulatory Visit: Payer: Self-pay

## 2021-03-07 DIAGNOSIS — K0889 Other specified disorders of teeth and supporting structures: Secondary | ICD-10-CM | POA: Insufficient documentation

## 2021-03-07 MED ORDER — CLINDAMYCIN HCL 150 MG PO CAPS
300.0000 mg | ORAL_CAPSULE | Freq: Once | ORAL | Status: AC
  Administered 2021-03-07: 300 mg via ORAL
  Filled 2021-03-07: qty 2

## 2021-03-07 MED ORDER — OXYCODONE-ACETAMINOPHEN 5-325 MG PO TABS
1.0000 | ORAL_TABLET | Freq: Once | ORAL | Status: AC
  Administered 2021-03-07: 1 via ORAL
  Filled 2021-03-07: qty 1

## 2021-03-07 MED ORDER — LIDOCAINE VISCOUS HCL 2 % MT SOLN: 5.0000 mL | Freq: Three times a day (TID) | OROMUCOSAL | 0 refills | Status: DC | PRN

## 2021-03-07 MED ORDER — CLINDAMYCIN HCL 150 MG PO CAPS: 300.0000 mg | ORAL_CAPSULE | Freq: Three times a day (TID) | ORAL | 0 refills | Status: AC

## 2021-03-07 NOTE — ED Triage Notes (Signed)
Has rt upper dental pain, states this past Friday had noted swelling at rt side of face. Took ibuprofen PO this am

## 2021-03-07 NOTE — ED Provider Notes (Signed)
[facility] EMERGENCY DEPARTMENT Provider Note   CSN: [id number] Arrival date & time: 03/07/21  1027     History  Chief Complaint  Patient presents with   Dental Pain    Juan Mcdonald is a 31 y.o. male presented ED with dental pain on the right upper molar.  He reports he seen a dentist in the past and told he needs to have one of his incisors pulled.  He feels that his or worsening swelling and pain around his upper gumline in his right upper cheek for the past several days, and is worried he may have a dental infection.  He does have allergies to penicillins.  He has been treated for dental infection with antibiotics in the past, possibly with clindamycin, but none within the past 6 to 12 months that he can remember.  HPI     Home Medications Prior to Admission medications   Medication Sig Start Date End Date Taking? Authorizing Provider  clindamycin (CLEOCIN) 150 MG capsule Take 2 capsules (300 mg total) by mouth 3 (three) times daily for 7 days. 03/07/21 03/14/21 Yes Velda Wendt, Kermit Balo, MD  magic mouthwash (lidocaine, diphenhydrAMINE, alum & mag hydroxide) suspension Swish and spit 5 mLs 3 (three) times daily as needed for mouth pain. Do NOT swallow 03/07/21  Yes Dan Scearce, Kermit Balo, MD  acetaminophen (TYLENOL) 325 MG tablet Take 2 tablets (650 mg total) by mouth every 6 (six) hours as needed. Do not take more than 4000mg  of tylenol per day 07/28/20   Couture, Cortni S, PA-C  famotidine (PEPCID) 20 MG tablet TAKE 1 TABLET BY MOUTH TWICE DAILY 02/10/20 02/09/21  04/09/21, MD  sucralfate (CARAFATE) 1 g tablet TAKE 1 TABLET BY MOUTH 4 TIMES DAILY WITH MEALS AND AT BEDTIME 02/10/20 02/09/21  04/09/21, MD      Allergies    Aspirin and Penicillins    Review of Systems   Review of Systems  Physical Exam Updated Vital Signs BP 118/79 (BP Location: Right Arm)    Pulse 78    Temp 98.2 F (36.8 C) (Oral)    Resp 16    Ht 5\' 9"  (1.753 m)    Wt 89.4 kg    SpO2 100%    BMI  29.09 kg/m  Physical Exam Constitutional:      General: He is not in acute distress. HENT:     Head: Normocephalic and atraumatic.     Comments: Chipped tooth right upper molar; no peridental abscess Tenderness of right maxilla without edema or fluctuance Cardiovascular:     Rate and Rhythm: Normal rate and regular rhythm.  Pulmonary:     Effort: Pulmonary effort is normal. No respiratory distress.  Skin:    General: Skin is warm and dry.  Neurological:     General: No focal deficit present.     Mental Status: He is alert. Mental status is at baseline.  Psychiatric:        Mood and Affect: Mood normal.        Behavior: Behavior normal.    ED Results / Procedures / Treatments   Labs (all labs ordered are listed, but only abnormal results are displayed) Labs Reviewed - No data to display  EKG None  Radiology No results found.  Procedures Procedures    Medications Ordered in ED Medications  oxyCODONE-acetaminophen (PERCOCET/ROXICET) 5-325 MG per tablet 1 tablet (1 tablet Oral Given 03/07/21 0918)  clindamycin (CLEOCIN) capsule 300 mg (300 mg Oral Given 03/07/21 0918)  ED Course/ Medical Decision Making/ A&P                           Medical Decision Making Risk Prescription drug management.   Patient is here with dental pain, suspected possible dental infection.  I do not see a peridental abscess for drainage in the ED.  No evidence of Ludewig's angina.  I do think is reasonable to start him on antibiotics, with his allergies to penicillin I think clindamycin would be reasonable.  A pain pill was given in the ED.  I prescribed Magic mouthwash, which may help with an exposed nerve root if this is the case (not clearly visible on exam).  Provided him the information for the on-call dental clinic.  Explained that this needs to be evaluated by dentist, which he understands        Final Clinical Impression(s) / ED Diagnoses Final diagnoses:  Pain, dental    Rx  / DC Orders ED Discharge Orders          Ordered    clindamycin (CLEOCIN) 150 MG capsule  3 times daily        03/07/21 0854    magic mouthwash (lidocaine, diphenhydrAMINE, alum & mag hydroxide) suspension  3 times daily PRN        03/07/21 0854              Terald Sleeper, MD 03/07/21 [phone]

## 2021-03-08 ENCOUNTER — Telehealth (HOSPITAL_BASED_OUTPATIENT_CLINIC_OR_DEPARTMENT_OTHER): Payer: Self-pay | Admitting: Emergency Medicine

## 2022-11-23 IMAGING — CR DG FOOT COMPLETE 3+V*L*
3 series · 3 of 3 positions shown · non-contrast
Comparison: None.

CLINICAL DATA: Pain

EXAM:
LEFT FOOT - COMPLETE 3+ VIEW

[t foot ap left]
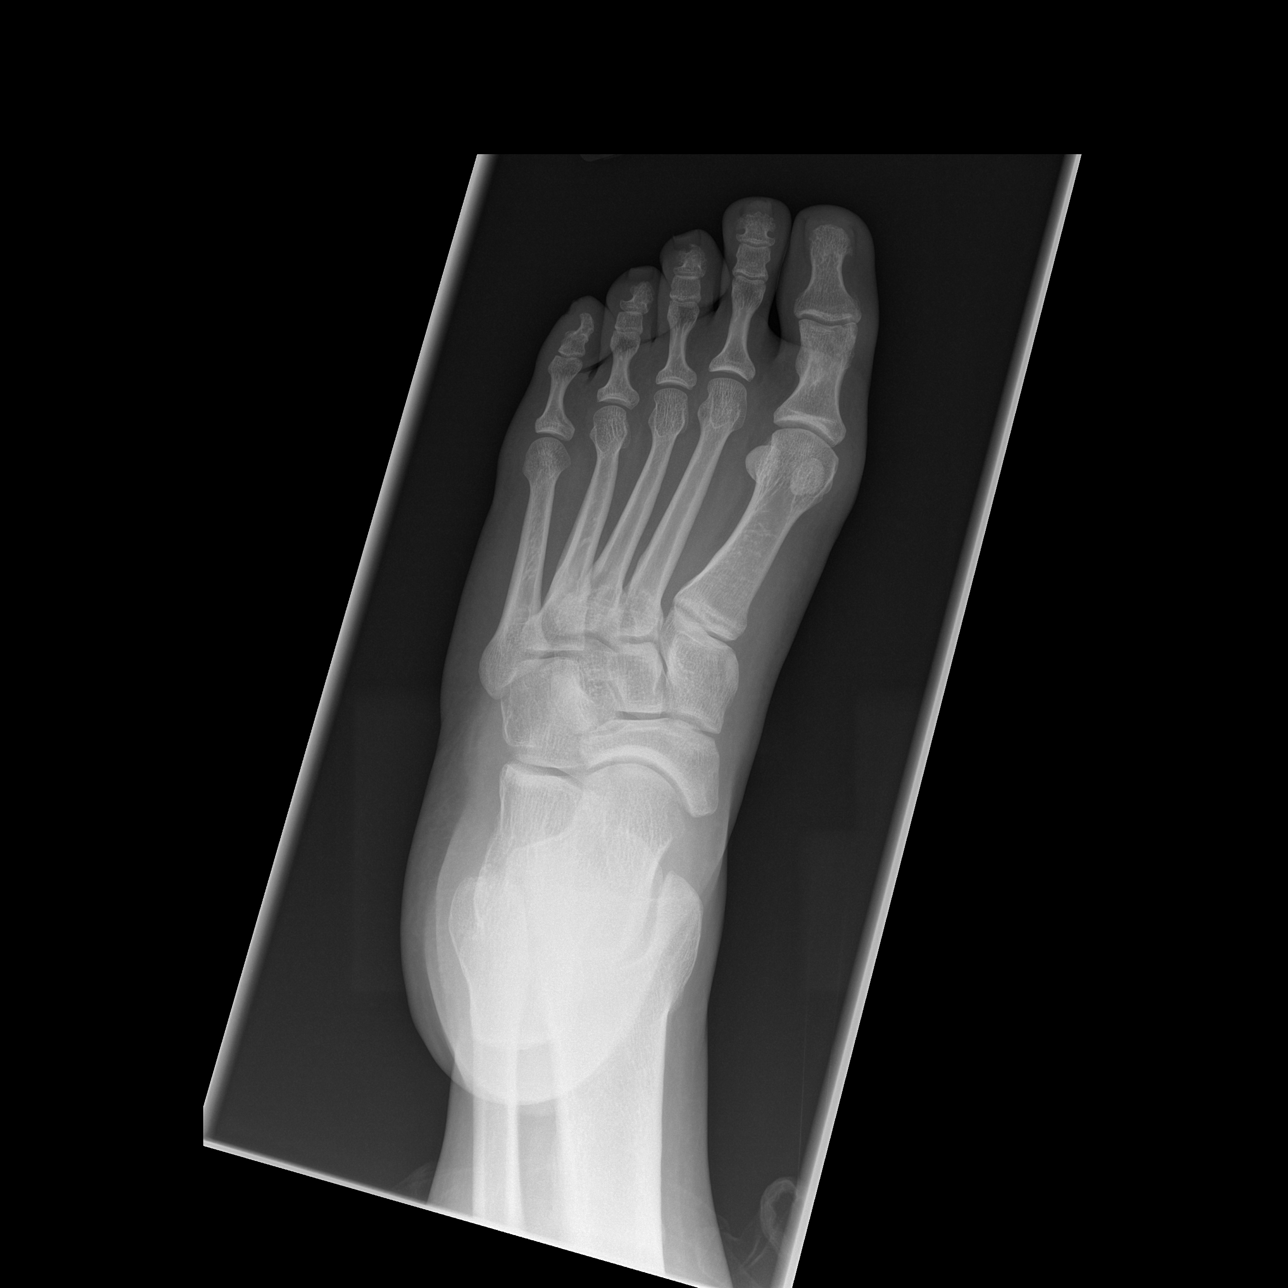

[t foot oblique left]
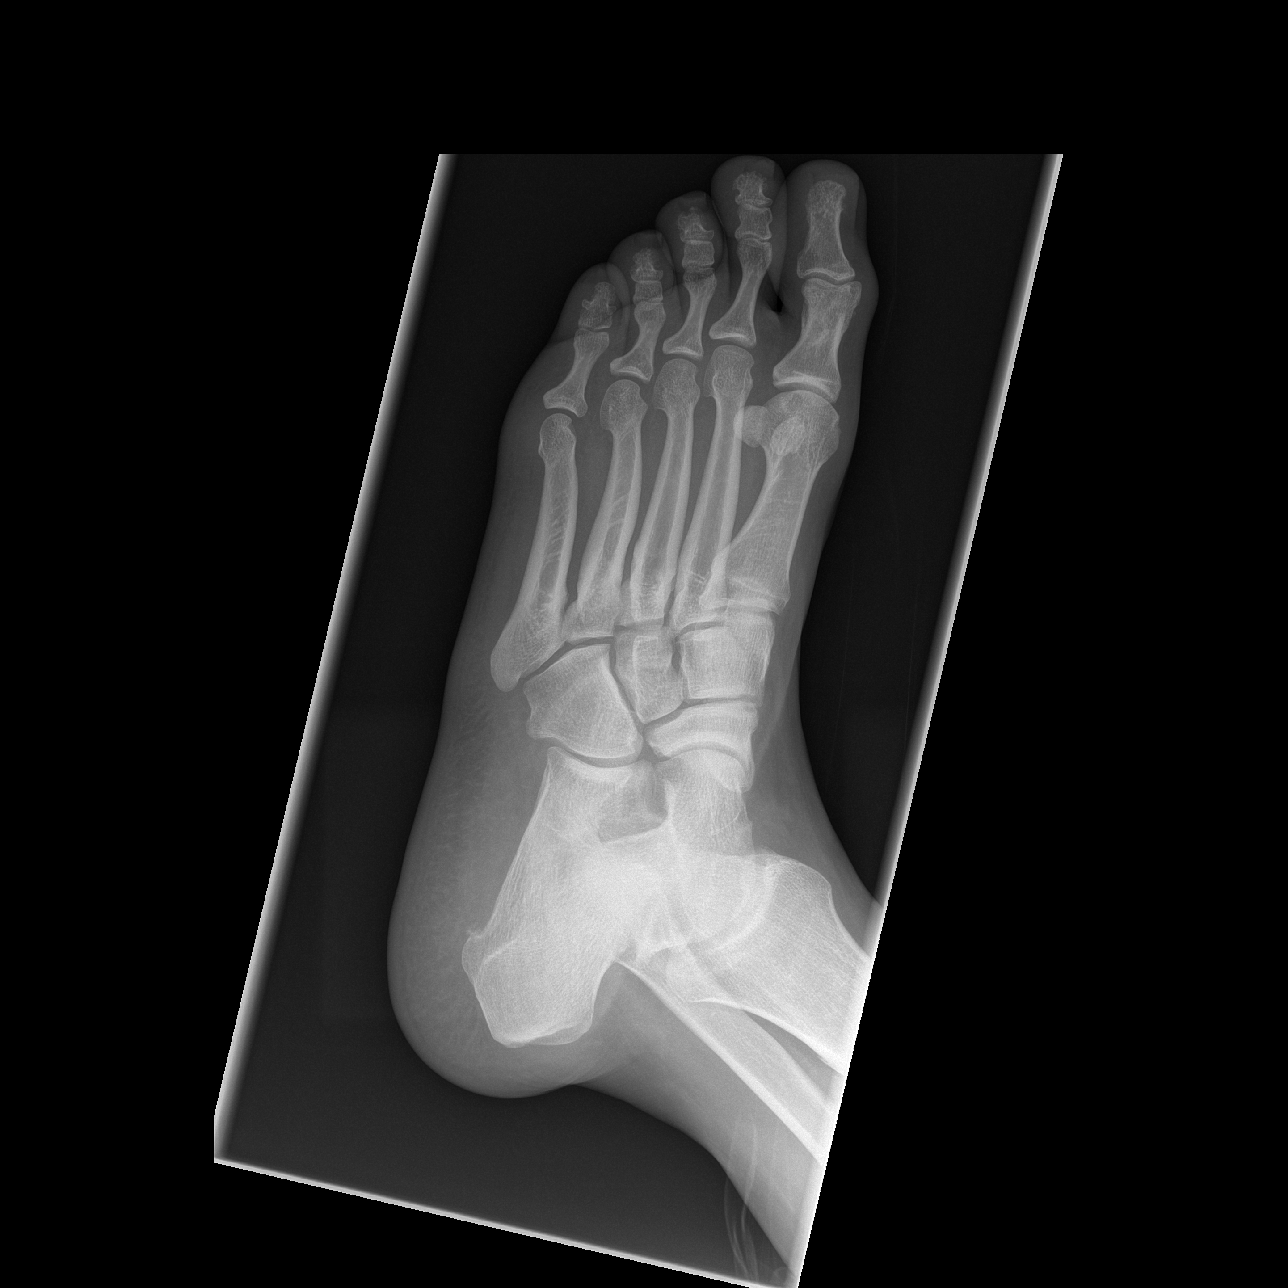

[t foot lat left]
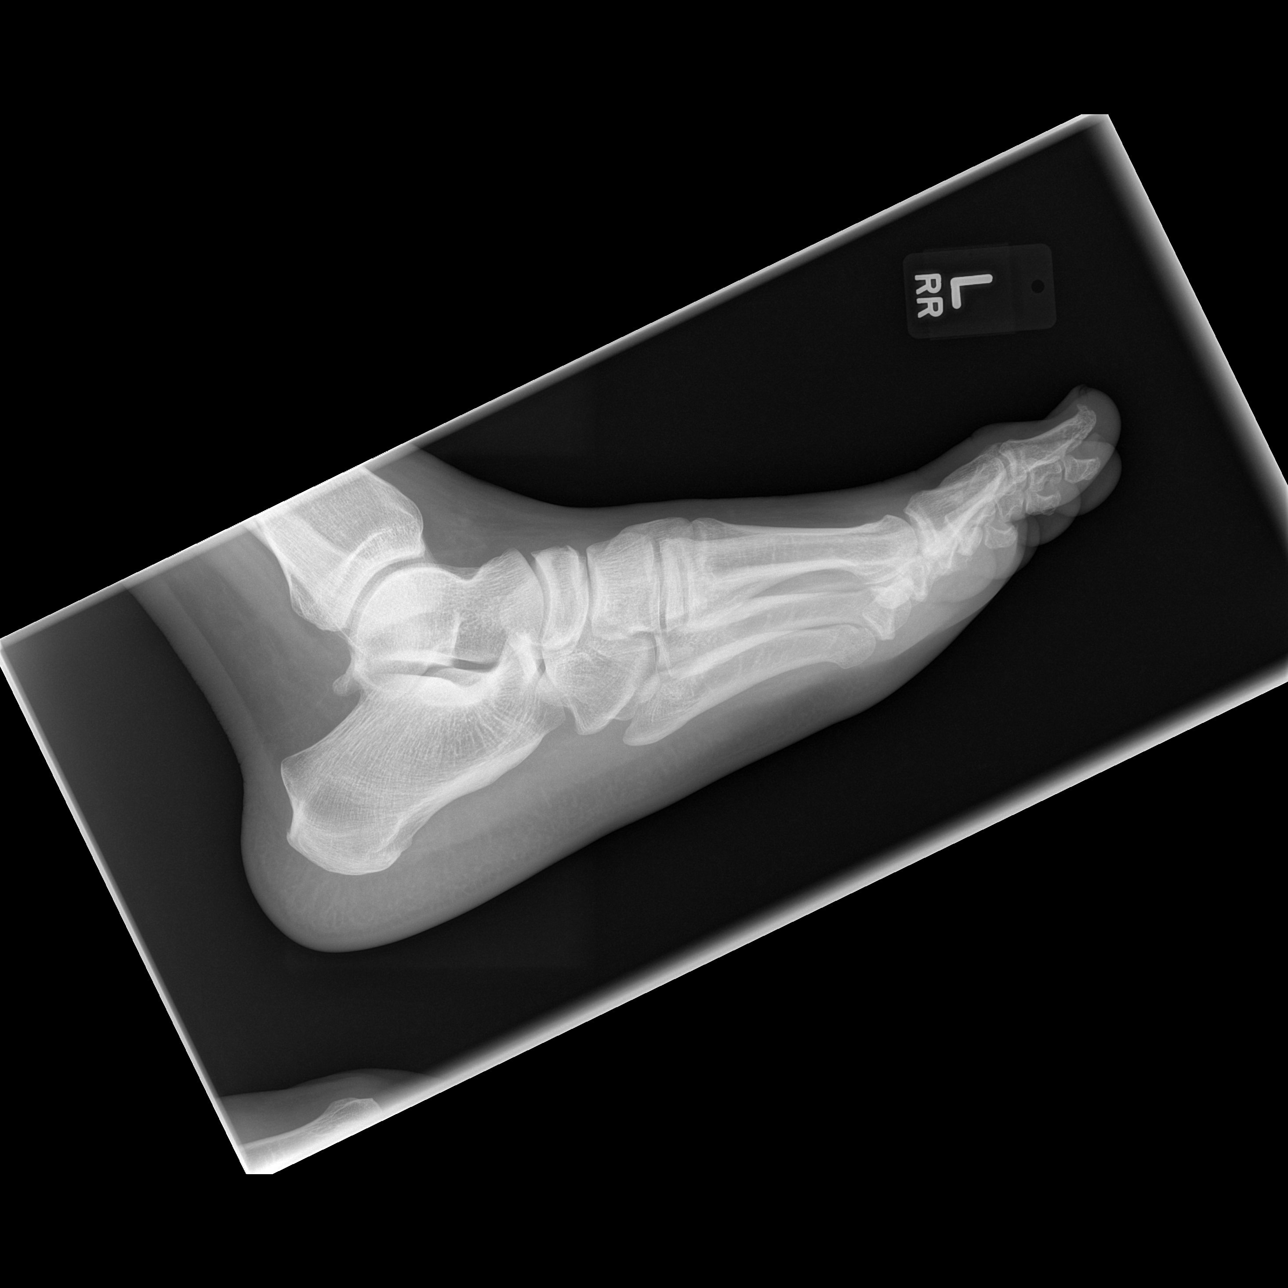

[3 of 3 positions shown; findings below may reference images not displayed]

FINDINGS: Frontal, oblique, and lateral views were obtained. No fracture or
dislocation. Joint spaces appear normal. No erosive change.
IMPRESSION: No fracture or dislocation.  No evident arthropathy.

## 2022-12-04 ENCOUNTER — Encounter (HOSPITAL_COMMUNITY): Payer: Self-pay

## 2022-12-04 ENCOUNTER — Other Ambulatory Visit: Payer: Self-pay

## 2022-12-04 ENCOUNTER — Emergency Department (HOSPITAL_COMMUNITY)
Admission: EM | Admit: 2022-12-04 | Discharge: 2022-12-04 | Disposition: A | Discharge status: Home / Self Care | Payer: BC Managed Care – PPO | Attending: Emergency Medicine | Admitting: Emergency Medicine

## 2022-12-04 DIAGNOSIS — L02411 Cutaneous abscess of right axilla: Secondary | ICD-10-CM | POA: Insufficient documentation

## 2022-12-04 DIAGNOSIS — L0390 Cellulitis, unspecified: Secondary | ICD-10-CM | POA: Insufficient documentation

## 2022-12-04 MED ORDER — SULFAMETHOXAZOLE-TRIMETHOPRIM 800-160 MG PO TABS: 1.0000 | ORAL_TABLET | Freq: Two times a day (BID) | ORAL | 0 refills | Status: AC

## 2022-12-04 MED ORDER — SULFAMETHOXAZOLE-TRIMETHOPRIM 800-160 MG PO TABS
1.0000 | ORAL_TABLET | Freq: Once | ORAL | Status: AC
  Administered 2022-12-04: 1 via ORAL
  Filled 2022-12-04: qty 1

## 2022-12-04 MED ORDER — OXYCODONE-ACETAMINOPHEN 5-325 MG PO TABS
1.0000 | ORAL_TABLET | Freq: Once | ORAL | Status: AC
  Administered 2022-12-04: 1 via ORAL
  Filled 2022-12-04: qty 1

## 2022-12-04 MED ORDER — OXYCODONE-ACETAMINOPHEN 5-325 MG PO TABS: 1.0000 | ORAL_TABLET | Freq: Four times a day (QID) | ORAL | 0 refills | Status: DC | PRN

## 2022-12-04 NOTE — Discharge Instructions (Signed)
Please read and follow all provided instructions.  Your diagnoses today include:  1. Abscess of right axilla   2. Cellulitis of skin     Tests performed today include: Vital signs. See below for your results today.   Medications prescribed:  Bactrim (trimethoprim/sulfamethoxazole) - antibiotic  You have been prescribed an antibiotic medicine: take the entire course of medicine even if you are feeling better. Stopping early can cause the antibiotic not to work.  Percocet (oxycodone/acetaminophen) - narcotic pain medication  DO NOT drive or perform any activities that require you to be awake and alert because this medicine can make you drowsy. BE VERY CAREFUL not to take multiple medicines containing Tylenol (also called acetaminophen). Doing so can lead to an overdose which can damage your liver and cause liver failure and possibly death.  Take any prescribed medications only as directed.   Home care instructions:  Follow any educational materials contained in this packet  Follow-up instructions: Return to the Emergency Department in 48 hours for a recheck if your symptoms are not significantly improved.  Return instructions:  Return to the Emergency Department if you have: Fever Worsening symptoms Worsening pain Worsening swelling Redness of the skin that moves away from the affected area, especially if it streaks away from the affected area  Any other emergent concerns   Your vital signs today were: BP 117/80 (BP Location: Left Arm)   Pulse (!) 55   Temp 98.4 F (36.9 C)   Resp 18   Ht 5\' 9"  (1.753 m)   Wt 93.9 kg   SpO2 93%   BMI 30.57 kg/m  If your blood pressure (BP) was elevated above 135/85 this visit, please have this repeated by your doctor within one month. --------------

## 2022-12-04 NOTE — ED Triage Notes (Signed)
R underarm boil that started getting red and swollen and painful x 3 days. Pt states he did warm compresses w/o relief.

## 2022-12-04 NOTE — ED Provider Notes (Signed)
[place] EMERGENCY DEPARTMENT AT [facility] Provider Note   CSN: [id number] Arrival date & time: 12/04/22  1147     History  Chief Complaint  Patient presents with   Abscess    Juan Mcdonald is a 32 y.o. male.  Patient with history of axillary and groin abscesses presents to the emergency department today for several days of worse right axillary pain and swelling.  No drainage.  No fevers.  Redness has extended somewhat to the anterior chest wall.  Patient has been treated with antibiotics in the past and this typically helps.  They report 1 time where it needed to be drained.  Patient is averse to this because they do not feel that the area heals well.  No history of diabetes.       Home Medications Prior to Admission medications   Medication Sig Start Date End Date Taking? Authorizing Provider  oxyCODONE-acetaminophen (PERCOCET/ROXICET) 5-325 MG tablet Take 1 tablet by mouth every 6 (six) hours as needed for severe pain (pain score 7-10). 12/04/22  Yes Renne Crigler, PA-C  sulfamethoxazole-trimethoprim (BACTRIM DS) 800-160 MG tablet Take 1 tablet by mouth 2 (two) times daily for 7 days. 12/04/22 12/11/22 Yes Renne Crigler, PA-C  famotidine (PEPCID) 20 MG tablet TAKE 1 TABLET BY MOUTH TWICE DAILY 02/10/20 02/09/21  Tilden Fossa, MD  magic mouthwash (lidocaine, diphenhydrAMINE, alum & mag hydroxide) suspension Swish and spit 5 mLs 3 (three) times daily as needed for mouth pain. Do NOT swallow 03/07/21   Terald Sleeper, MD  sucralfate (CARAFATE) 1 g tablet TAKE 1 TABLET BY MOUTH 4 TIMES DAILY WITH MEALS AND AT BEDTIME 02/10/20 02/09/21  Tilden Fossa, MD      Allergies    Aspirin and Penicillins    Review of Systems   Review of Systems  Physical Exam Updated Vital Signs BP 117/80 (BP Location: Left Arm)   Pulse (!) 55   Temp 98.4 F (36.9 C)   Resp 18   Ht 5\' 9"  (1.753 m)   Wt 93.9 kg   SpO2 93%   BMI 30.57 kg/m  Physical Exam Vitals and nursing  note reviewed.  Constitutional:      Appearance: He is well-developed.  HENT:     Head: Normocephalic and atraumatic.  Eyes:     Conjunctiva/sclera: Conjunctivae normal.  Pulmonary:     Effort: No respiratory distress.  Musculoskeletal:     Cervical back: Normal range of motion and neck supple.  Skin:    General: Skin is warm and dry.     Comments: There is an area of induration and fluctuance approximately 4 cm x 2 cm in size in the right axilla.  There is overlying erythema and some extension of the cellulitis onto the lateral anterior chest wall.  Area is very tender.  Patient pushes my hand away as I gently palpate the area.  Neurological:     Mental Status: He is alert.     ED Results / Procedures / Treatments   Labs (all labs ordered are listed, but only abnormal results are displayed) Labs Reviewed - No data to display  EKG None  Radiology No results found.  Procedures Procedures    Medications Ordered in ED Medications  sulfamethoxazole-trimethoprim (BACTRIM DS) 800-160 MG per tablet 1 tablet (has no administration in time range)  oxyCODONE-acetaminophen (PERCOCET/ROXICET) 5-325 MG per tablet 1 tablet (has no administration in time range)    ED Course/ Medical Decision Making/ A&P    Patient  seen and examined. History obtained directly from patient.  I did review her previous emergency department note for similar complaint.  At that time, patient declined I&D, and was treated with Bactrim.  Labs/EKG: None ordered  Imaging: None ordered  Medications/Fluids: Ordered: P.o. trimethoprim/sulfamethoxazole, p.o. Percocet  Most recent vital signs reviewed and are as follows: BP 115/69   Pulse 80   Temp 98.4 F (36.9 C)   Resp 18   Ht 5\' 9"  (1.753 m)   Wt 93.9 kg   SpO2 100%   BMI 30.57 kg/m   Initial impression: Right axillary abscess with associated cellulitis.  I discussed with patient and family at bedside that the ideal treatment would be incision  and drainage today.  We discussed the risks and benefits of each.  Patient states good success with oral antibiotics in the past.  Typically the abscess will come to ahead and break open and then will heal.  They prefer no I&D at this time and trial of antibiotics.  We did discuss symptoms which should cause return to the emergency department including worsening pain, swelling, fever, or if they change their mind about having the procedure performed.  Home treatment plan: Warm compresses.  # 5 tablets oxycodone/apap 5/325mg  prescribed.  Patient counseled on use of narcotic pain medications. Counseled not to combine these medications with others containing tylenol. Urged not to drink alcohol, drive, or perform any other activities that requires focus while taking these medications. The patient verbalizes understanding and agrees with the plan.  Return instructions discussed with patient: The patient was urged to return to the Emergency Department urgently with worsening pain, swelling, expanding erythema especially if it streaks away from the affected area, fever, or if they have any other concerns.   The patient was urged to return to the Emergency Department or go to their PCP in 48 hours for wound recheck if the area is not significantly improved.  The patient verbalized understanding and stated agreement with this plan.                                 Medical Decision Making Risk Prescription drug management.   Patient with right axillary abscess and associated cellulitis.  This area is of a sufficient size and would be best served by incision and drainage, however patient is reluctant to have this performed now.  They would like to try a course of antibiotics.  This was prescribed.  Return instructions and follow-up instructions as above.  Patient does state that they would return if the symptoms do get worse.         Final Clinical Impression(s) / ED Diagnoses Final diagnoses:   Abscess of right axilla  Cellulitis of skin    Rx / DC Orders ED Discharge Orders          Ordered    sulfamethoxazole-trimethoprim (BACTRIM DS) 800-160 MG tablet  2 times daily        12/04/22 1327    oxyCODONE-acetaminophen (PERCOCET/ROXICET) 5-325 MG tablet  Every 6 hours PRN        12/04/22 1327              Renne Crigler, PA-C 12/04/22 1336    Gerhard Munch, MD 12/05/22 1512

## 2022-12-27 ENCOUNTER — Encounter (HOSPITAL_BASED_OUTPATIENT_CLINIC_OR_DEPARTMENT_OTHER): Payer: Self-pay

## 2022-12-27 ENCOUNTER — Emergency Department (HOSPITAL_BASED_OUTPATIENT_CLINIC_OR_DEPARTMENT_OTHER)
Admission: EM | Admit: 2022-12-27 | Discharge: 2022-12-27 | Disposition: A | Discharge status: Home / Self Care | Payer: Self-pay | Attending: Emergency Medicine | Admitting: Emergency Medicine

## 2022-12-27 DIAGNOSIS — R590 Localized enlarged lymph nodes: Secondary | ICD-10-CM | POA: Insufficient documentation

## 2022-12-27 DIAGNOSIS — K047 Periapical abscess without sinus: Secondary | ICD-10-CM | POA: Insufficient documentation

## 2022-12-27 MED ORDER — IBUPROFEN 800 MG PO TABS
800.0000 mg | ORAL_TABLET | Freq: Once | ORAL | Status: AC
  Administered 2022-12-27: 800 mg via ORAL
  Filled 2022-12-27: qty 1

## 2022-12-27 MED ORDER — CLINDAMYCIN HCL 300 MG PO CAPS: 300.0000 mg | ORAL_CAPSULE | Freq: Four times a day (QID) | ORAL | 0 refills | Status: DC

## 2022-12-27 MED ORDER — IBUPROFEN 600 MG PO TABS: 600.0000 mg | ORAL_TABLET | Freq: Four times a day (QID) | ORAL | 0 refills | Status: DC | PRN

## 2022-12-27 NOTE — ED Notes (Signed)
Discharge instructions reviewed with patient. Patient verbalizes understanding, no further questions at this time. Medications/prescriptions and follow up information provided. No acute distress noted at time of departure.  

## 2022-12-27 NOTE — Discharge Instructions (Addendum)
You have been evaluated for your dental infection.  Please take antibiotic as prescribed and follow-up closely with a dentist for further care.

## 2022-12-27 NOTE — ED Provider Notes (Signed)
[place] [facility] AT [facility] Provider Note   CSN: [id number] Arrival date & time: 12/27/22  1016     History  Chief Complaint  Patient presents with   Dental Pain    Juan Mcdonald is a 32 y.o. male.  The history is provided by the patient and medical records. No language interpreter was used.  Dental Pain    32 year old male presenting with complaint of dental pain.  Patient report for the past 3 days he has noticed increasing pain and swelling to his right lower jaw.  Pain worse with chewing, moderate intensity without any fevers or trouble swallowing chest pain or shortness of breath.  Denies any recent injury.  He does not have a dentist.  No hearing changes no headache.  Home Medications Prior to Admission medications   Medication Sig Start Date End Date Taking? Authorizing Provider  famotidine (PEPCID) 20 MG tablet TAKE 1 TABLET BY MOUTH TWICE DAILY 02/10/20 02/09/21  Tilden Fossa, MD  magic mouthwash (lidocaine, diphenhydrAMINE, alum & mag hydroxide) suspension Swish and spit 5 mLs 3 (three) times daily as needed for mouth pain. Do NOT swallow 03/07/21   Terald Sleeper, MD  oxyCODONE-acetaminophen (PERCOCET/ROXICET) 5-325 MG tablet Take 1 tablet by mouth every 6 (six) hours as needed for severe pain (pain score 7-10). 12/04/22   Renne Crigler, PA-C  sucralfate (CARAFATE) 1 g tablet TAKE 1 TABLET BY MOUTH 4 TIMES DAILY WITH MEALS AND AT BEDTIME 02/10/20 02/09/21  Tilden Fossa, MD      Allergies    Aspirin and Penicillins    Review of Systems   Review of Systems  All other systems reviewed and are negative.   Physical Exam Updated Vital Signs BP (!) 132/93 (BP Location: Left Arm)   Pulse 98   Temp 98.6 F (37 C) (Oral)   Resp 18   SpO2 100%  Physical Exam Vitals reviewed.  Constitutional:      Appearance: Normal appearance.  HENT:     Head: Normocephalic and atraumatic.     Mouth/Throat:     Comments: Mouth: Gingival  erythema and some swelling noted at the gingival region between tooth 30 and 31 with tenderness to palpation.  No significant dental decay noted.  No stridor or trismus.  No adjacent facial swelling. Musculoskeletal:     Cervical back: Normal range of motion and neck supple.  Lymphadenopathy:     Cervical: Cervical adenopathy present.  Neurological:     Mental Status: He is alert.     ED Results / Procedures / Treatments   Labs (all labs ordered are listed, but only abnormal results are displayed) Labs Reviewed - No data to display  EKG None  Radiology No results found.  Procedures Procedures    Medications Ordered in ED Medications  ibuprofen (ADVIL) tablet 800 mg (has no administration in time range)    ED Course/ Medical Decision Making/ A&P                                 Medical Decision Making  BP (!) 132/93 (BP Location: Left Arm)   Pulse 98   Temp 98.6 F (37 C) (Oral)   Resp 18   SpO2 100%   49:45 AM  32 year old male presenting with complaint of dental pain.  Patient report for the past 3 days he has noticed increasing pain and swelling to his right lower jaw.  Pain worse with chewing, moderate intensity without any fevers or trouble swallowing chest pain or shortness of breath.  Denies any recent injury.  He does not have a dentist.  No hearing changes no headache.  Exam remarkable for edema noted to right lower jaw involving the gingiva between tooth 30 and 31.  This is likely an early forming abscess not amenable for I&D at this time.  No facial involvement no airway involvement.  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  Suspect periapical abscess and since patient is allergic to penicillin, will treat with clindamycin and anti-inflammatory medication.  I will give patient referral to dentist for outpatient management.  Imaging including maxillofacial CT scan considered but not performed as I have low suspicion for deep tissue infection.  Patient without  finding to suggest hospitalization.  Ibuprofen given for pain with improvement of symptoms.  Social determinant health including tobacco use.        Final Clinical Impression(s) / ED Diagnoses Final diagnoses:  Periapical abscess    Rx / DC Orders ED Discharge Orders          Ordered    clindamycin (CLEOCIN) 300 MG capsule  4 times daily        12/27/22 1136    ibuprofen (ADVIL) 600 MG tablet  Every 6 hours PRN        12/27/22 1136              Fayrene Helper, PA-C 12/27/22 1137    Benjiman Core, MD 12/27/22 1426

## 2022-12-27 NOTE — ED Triage Notes (Signed)
C/o right lower dental pain. Emesis yesterday. Does not have dentist.

## 2023-03-21 ENCOUNTER — Emergency Department (HOSPITAL_BASED_OUTPATIENT_CLINIC_OR_DEPARTMENT_OTHER)
Admission: EM | Admit: 2023-03-21 | Discharge: 2023-03-21 | Disposition: A | Discharge status: Home / Self Care | Payer: Self-pay | Attending: Emergency Medicine | Admitting: Emergency Medicine

## 2023-03-21 ENCOUNTER — Encounter (HOSPITAL_BASED_OUTPATIENT_CLINIC_OR_DEPARTMENT_OTHER): Payer: Self-pay

## 2023-03-21 ENCOUNTER — Other Ambulatory Visit: Payer: Self-pay

## 2023-03-21 DIAGNOSIS — J101 Influenza due to other identified influenza virus with other respiratory manifestations: Secondary | ICD-10-CM | POA: Insufficient documentation

## 2023-03-21 DIAGNOSIS — M791 Myalgia, unspecified site: Secondary | ICD-10-CM | POA: Insufficient documentation

## 2023-03-21 DIAGNOSIS — R0981 Nasal congestion: Secondary | ICD-10-CM

## 2023-03-21 DIAGNOSIS — Z720 Tobacco use: Secondary | ICD-10-CM | POA: Insufficient documentation

## 2023-03-21 DIAGNOSIS — R051 Acute cough: Secondary | ICD-10-CM

## 2023-03-21 LAB — RESP PANEL BY RT-PCR (RSV, FLU A&B, COVID)  RVPGX2
Influenza A by PCR: POSITIVE — AB
Influenza B by PCR: NEGATIVE
Resp Syncytial Virus by PCR: NEGATIVE
SARS Coronavirus 2 by RT PCR: NEGATIVE

## 2023-03-21 MED ORDER — CETIRIZINE-PSEUDOEPHEDRINE ER 5-120 MG PO TB12: 1.0000 | ORAL_TABLET | Freq: Every day | ORAL | 0 refills | Status: AC | PRN

## 2023-03-21 MED ORDER — IBUPROFEN 600 MG PO TABS: 600.0000 mg | ORAL_TABLET | Freq: Four times a day (QID) | ORAL | 0 refills | Status: AC | PRN

## 2023-03-21 MED ORDER — OSELTAMIVIR PHOSPHATE 75 MG PO CAPS: 75.0000 mg | ORAL_CAPSULE | Freq: Two times a day (BID) | ORAL | 0 refills | Status: AC

## 2023-03-21 MED ORDER — BENZONATATE 100 MG PO CAPS: 100.0000 mg | ORAL_CAPSULE | Freq: Three times a day (TID) | ORAL | 0 refills | Status: AC | PRN

## 2023-03-21 NOTE — ED Triage Notes (Signed)
Pt reports chill, headache and body aches that started yesterday.

## 2023-03-21 NOTE — Discharge Instructions (Signed)
As discussed, your viral testing was positive for influenza which is most likely causing symptoms.  Will begin you on medication called Tamiflu which tends to creased duration of illness by about half a day to a day.  Will recommend allergy medicine such as Zyrtec/Claritin/Allegra as well as nasal steroid spray such as Nasacort/Flonase for treatment of your symptoms.  Will also send in cough suppressant to use as needed.  Continue take Tylenol/Motrin for any body ache, headache, fever.  Please do not hesitate to return if the worrisome signs and symptoms we discussed become apparent.

## 2023-03-27 NOTE — ED Provider Notes (Signed)
 [place] [facility] Provider Note   CSN: [id number] Arrival date & time: 03/21/23  0845     History  Chief Complaint  Patient presents with   Chills    Juan Mcdonald is a 33 y.o. male.  HPI   33 year old male presents emergency department complaints of headache, body aches, chills, nasal congestion that began yesterday.  Reports potential sick exposures in the patient contacts.  Denies any known/documented fever.  Denies any chest pain, shortness of breath abdominal pain, nausea vomiting, urinary symptoms, change in bowel habits.  Have been trying over-the-counter medications with some improvement of symptoms.  Presents emergency department for further assessment/evaluation.  Past medical history significant for gastric ulcer, seizure  Home Medications Prior to Admission medications   Medication Sig Start Date End Date Taking? Authorizing Provider  benzonatate (TESSALON) 100 MG capsule Take 1 capsule (100 mg total) by mouth 3 (three) times daily as needed. 03/21/23  Yes Sherian Maroon A, PA  cetirizine-pseudoephedrine (ZYRTEC-D) 5-120 MG tablet Take 1 tablet by mouth daily as needed for allergies or rhinitis. 03/21/23  Yes Sherian Maroon A, PA  ibuprofen (ADVIL) 600 MG tablet Take 1 tablet (600 mg total) by mouth every 6 (six) hours as needed. 03/21/23  Yes Sherian Maroon A, PA  oseltamivir (TAMIFLU) 75 MG capsule Take 1 capsule (75 mg total) by mouth every 12 (twelve) hours. 03/21/23  Yes Sherian Maroon A, PA  famotidine (PEPCID) 20 MG tablet TAKE 1 TABLET BY MOUTH TWICE DAILY 02/10/20 02/09/21  Tilden Fossa, MD  sucralfate (CARAFATE) 1 g tablet TAKE 1 TABLET BY MOUTH 4 TIMES DAILY WITH MEALS AND AT BEDTIME 02/10/20 02/09/21  Tilden Fossa, MD      Allergies    Aspirin and Penicillins    Review of Systems   Review of Systems  All other systems reviewed and are negative.   Physical Exam Updated Vital Signs BP (!) 149/102 (BP  Location: Right Arm)   Pulse 99   Temp 98.7 F (37.1 C) (Oral)   Resp 18   SpO2 98%  Physical Exam Vitals and nursing note reviewed.  Constitutional:      General: He is not in acute distress.    Appearance: He is well-developed.  HENT:     Head: Normocephalic and atraumatic.     Nose: No congestion.     Mouth/Throat:     Mouth: Mucous membranes are moist.     Pharynx: Oropharynx is clear. Posterior oropharyngeal erythema present.  Eyes:     Conjunctiva/sclera: Conjunctivae normal.  Cardiovascular:     Rate and Rhythm: Normal rate and regular rhythm.     Heart sounds: No murmur heard. Pulmonary:     Effort: Pulmonary effort is normal. No respiratory distress.     Breath sounds: Normal breath sounds. No wheezing, rhonchi or rales.  Chest:     Chest wall: No tenderness.  Abdominal:     Palpations: Abdomen is soft.     Tenderness: There is no abdominal tenderness.  Musculoskeletal:        General: No swelling.     Cervical back: Normal range of motion and neck supple. No rigidity or tenderness.     Right lower leg: No edema.     Left lower leg: No edema.  Skin:    General: Skin is warm and dry.     Capillary Refill: Capillary refill takes less than 2 seconds.  Neurological:     Mental Status: He is  alert.     Comments: Alert and oriented to self, place, time and event.   Speech is fluent, clear without dysarthria or dysphasia.   Strength 5/5 in upper/lower extremities   Sensation intact in upper/lower extremities   Normal gait.  CN I not tested  CN II not tested CN III, IV, VI PERRLA and EOMs intact bilaterally  CN V Intact sensation to sharp and light touch to the face  CN VII facial movements symmetric  CN VIII not tested  CN IX, X no uvula deviation, symmetric rise of soft palate  CN XI 5/5 SCM and trapezius strength bilaterally  CN XII Midline tongue protrusion, symmetric L/R movements     Psychiatric:        Mood and Affect: Mood normal.     ED  Results / Procedures / Treatments   Labs (all labs ordered are listed, but only abnormal results are displayed) Labs Reviewed  RESP PANEL BY RT-PCR (RSV, FLU A&B, COVID)  RVPGX2 - Abnormal; Notable for the following components:      Result Value   Influenza A by PCR POSITIVE (*)    All other components within normal limits    EKG None  Radiology No results found.  Procedures Procedures    Medications Ordered in ED Medications - No data to display  ED Course/ Medical Decision Making/ A&P                                 Medical Decision Making Risk OTC drugs. Prescription drug management.   This patient presents to the ED for concern of bodyaches, headache, nasal congestion, this involves an extensive number of treatment options, and is a complaint that carries with it a high risk of complications and morbidity.  The differential diagnosis includes COVID, flu, RSV, pneumonia, meningitis, other   Co morbidities that complicate the patient evaluation  See HPI   Additional history obtained:  Additional history obtained from EMR External records from outside source obtained and reviewed including hospital records   Lab Tests:  I Ordered, and personally interpreted labs.  The pertinent results include: Viral testing positive for influenza A   Imaging Studies ordered:  N/a   Cardiac Monitoring: / EKG:  The patient was maintained on a cardiac monitor.  I personally viewed and interpreted the cardiac monitored which showed an underlying rhythm of: Sinus rhythm   Consultations Obtained:  N/a   Problem List / ED Course / Critical interventions / Medication management  Influenza A, body aches, nasal congestion, headache Reevaluation of the patient showed that the patient stayed the same I have reviewed the patients home medicines and have made adjustments as needed   Social Determinants of Health:  Cigarette use.  Denies illicit drug use.   Test /  Admission - Considered:  Influenza A, body aches, nasal congestion, headache, cough Vitals signs significant for hypertension. Otherwise within normal range and stable throughout visit. Laboratory studies significant for: See above 33 year old male presents emergency department with bodyaches, nasal congestion, headache, chills.  Symptoms again yesterday.  Multiple potential sick exposures in the outpatient setting.  On exam, lungs clear to auscultation; low suspicion for pneumonia.  Regarding headache, nonfocal neuroexam with no clinical evidence of meningismus.  Suspect headache likely secondary to viral process.  Patient with viral testing positive for influenza A which is most likely causing symptoms.  Patient within the window so begin Tamiflu at patient's  request.  Will recommend further symptomatic therapy as described in AVS.  Will recommend close follow-up with primary care in the outpatient setting.  Treatment plan discussed at length with patient and he acknowledged understanding was agreeable to said plan.  Patient overall well-appearing, afebrile in no acute distress. Worrisome signs and symptoms were discussed with the patient, and the patient acknowledged understanding to return to the ED if noticed. Patient was stable upon discharge.          Final Clinical Impression(s) / ED Diagnoses Final diagnoses:  Influenza A  Acute cough  Myalgia  Nasal congestion    Rx / DC Orders ED Discharge Orders          Ordered    ibuprofen (ADVIL) 600 MG tablet  Every 6 hours PRN        03/21/23 1054    benzonatate (TESSALON) 100 MG capsule  3 times daily PRN        03/21/23 1054    oseltamivir (TAMIFLU) 75 MG capsule  Every 12 hours        03/21/23 1054    cetirizine-pseudoephedrine (ZYRTEC-D) 5-120 MG tablet  Daily PRN        03/21/23 1054              Peter Garter, [place] 03/27/23 1423    Sloan Leiter, DO 03/27/23 2103

## 2023-04-15 ENCOUNTER — Emergency Department (HOSPITAL_BASED_OUTPATIENT_CLINIC_OR_DEPARTMENT_OTHER)
Admission: EM | Admit: 2023-04-15 | Discharge: 2023-04-15 | Disposition: A | Discharge status: Home / Self Care | Payer: Self-pay | Attending: Emergency Medicine | Admitting: Emergency Medicine

## 2023-04-15 ENCOUNTER — Other Ambulatory Visit: Payer: Self-pay

## 2023-04-15 ENCOUNTER — Encounter (HOSPITAL_BASED_OUTPATIENT_CLINIC_OR_DEPARTMENT_OTHER): Payer: Self-pay | Admitting: Emergency Medicine

## 2023-04-15 DIAGNOSIS — L0231 Cutaneous abscess of buttock: Secondary | ICD-10-CM | POA: Insufficient documentation

## 2023-04-15 DIAGNOSIS — M545 Low back pain, unspecified: Secondary | ICD-10-CM

## 2023-04-15 MED ORDER — METHOCARBAMOL 500 MG PO TABS
500.0000 mg | ORAL_TABLET | Freq: Once | ORAL | Status: AC
  Administered 2023-04-15: 500 mg via ORAL
  Filled 2023-04-15: qty 1

## 2023-04-15 MED ORDER — METHOCARBAMOL 500 MG PO TABS: 500.0000 mg | ORAL_TABLET | Freq: Two times a day (BID) | ORAL | 0 refills | Status: AC

## 2023-04-15 MED ORDER — SULFAMETHOXAZOLE-TRIMETHOPRIM 800-160 MG PO TABS
1.0000 | ORAL_TABLET | Freq: Once | ORAL | Status: AC
  Administered 2023-04-15: 1 via ORAL
  Filled 2023-04-15: qty 1

## 2023-04-15 MED ORDER — SULFAMETHOXAZOLE-TRIMETHOPRIM 800-160 MG PO TABS: 1.0000 | ORAL_TABLET | Freq: Two times a day (BID) | ORAL | 0 refills | Status: AC

## 2023-04-15 NOTE — Discharge Instructions (Addendum)
 You have been seen today for your complaint of back pain, right buttock abscess.  I have prescribed an antibiotic.  Just know that the abscess likely will not go away unless it is drained. Your discharge medications include Bactrim. This is an antibiotic. You should take it as prescribed. You should take it for the entire duration of the prescription. This may cause an upset stomach. This is normal. You may take this with food. You may also eat yogurt to prevent diarrhea. Robaxin. This is a muscle relaxer. It may cause drowsiness. Do not drive, operate heavy machinery or make important decisions when taking this medication. Only take it at night until you know how it affects you. Only take it as needed and take other medications such as ibuprofen or tylenol prior to trying this medication.   Home care instructions are as follows:  Apply warm compresses frequently until your abscess drains Follow up with: A primary care provider.  I have included the contact information for the [facility] Please seek immediate medical care if you develop any of the following symptoms: You have very bad pain. You make less pee (urine) than normal. At this time there does not appear to be the presence of an emergent medical condition, however there is always the potential for conditions to change. Please read and follow the below instructions.  Do not take your medicine if  develop an itchy rash, swelling in your mouth or lips, or difficulty breathing; call 911 and seek immediate emergency medical attention if this occurs.  You may review your lab tests and imaging results in their entirety on your MyChart account.  Please discuss all results of fully with your primary care provider and other specialist at your follow-up visit.  Note: Portions of this text may have been transcribed using voice recognition software. Every effort was made to ensure accuracy; however, inadvertent computerized  transcription errors may still be present.

## 2023-04-15 NOTE — ED Provider Notes (Signed)
 [place] [facility] Provider Note   CSN: [id number] Arrival date & time: 04/15/23  1216     History  Chief Complaint  Patient presents with   Back Pain   Abscess    Juan Mcdonald is a 33 y.o. male.  With a history of seizure disorder presenting to the ED for evaluation of low back pain and buttock abscess.  Low back pain began 3 days ago.  States he feels like he pulled a muscle at work.  He took a muscle relaxer with some improvement.  Pain radiates down the posterior of the right leg, mildly.  No urinary or bowel incontinence.  No fevers or chills.  No history of injection drug use.  No difficulty walking.  He states he then felt like he developed an abscess to the right buttock 2 days ago.  The abscess began draining blood and purulent drainage this morning.  He reports some mild pain with defecation.  Has a history of abscesses.  He is wondering if this is what is causing his back pain as well.  He states he did notice some purulent drainage in his bowel movement this morning.  States he has needed incision and drainage in the past but prefers not to do this due to extensive scarring.  No history of diabetes   Back Pain Abscess      Home Medications Prior to Admission medications   Medication Sig Start Date End Date Taking? Authorizing Provider  methocarbamol (ROBAXIN) 500 MG tablet Take 1 tablet (500 mg total) by mouth 2 (two) times daily for 7 days. 04/15/23 04/22/23 Yes Calee Nugent, Edsel Petrin, PA-C  sulfamethoxazole-trimethoprim (BACTRIM DS) 800-160 MG tablet Take 1 tablet by mouth 2 (two) times daily for 7 days. 04/15/23 04/22/23 Yes Kambryn Dapolito, Edsel Petrin, PA-C  benzonatate (TESSALON) 100 MG capsule Take 1 capsule (100 mg total) by mouth 3 (three) times daily as needed. 03/21/23   Peter Garter, PA  cetirizine-pseudoephedrine (ZYRTEC-D) 5-120 MG tablet Take 1 tablet by mouth daily as needed for allergies or rhinitis. 03/21/23   Peter Garter, PA  famotidine (PEPCID) 20 MG tablet TAKE 1 TABLET BY MOUTH TWICE DAILY 02/10/20 02/09/21  Tilden Fossa, MD  ibuprofen (ADVIL) 600 MG tablet Take 1 tablet (600 mg total) by mouth every 6 (six) hours as needed. 03/21/23   Peter Garter, PA  oseltamivir (TAMIFLU) 75 MG capsule Take 1 capsule (75 mg total) by mouth every 12 (twelve) hours. 03/21/23   Peter Garter, PA  sucralfate (CARAFATE) 1 g tablet TAKE 1 TABLET BY MOUTH 4 TIMES DAILY WITH MEALS AND AT BEDTIME 02/10/20 02/09/21  Tilden Fossa, MD      Allergies    Aspirin and Penicillins    Review of Systems   Review of Systems  Musculoskeletal:  Positive for back pain.  Skin:  Positive for wound.  All other systems reviewed and are negative.   Physical Exam Updated Vital Signs BP (!) 128/99 (BP Location: Left Arm)   Pulse 89   Temp 97.9 F (36.6 C) (Oral)   Resp 16   Ht 5\' 9"  (1.753 m)   Wt 98 kg   SpO2 98%   BMI 31.90 kg/m  Physical Exam Vitals and nursing note reviewed. Exam conducted with a chaperone present Landis Gandy, RN).  Constitutional:      General: He is not in acute distress.    Appearance: Normal appearance. He is normal weight. He is not ill-appearing.  HENT:     Head: Normocephalic and atraumatic.  Pulmonary:     Effort: Pulmonary effort is normal. No respiratory distress.  Abdominal:     General: Abdomen is flat.  Genitourinary:      Comments: Small open area to the right medial buttock draining small amounts of blood.  1 cm x 2 cm area of fluctuance just lateral to this area of drainage without any active drainage.  Overlying erythema.  Induration between area of drainage and area of fluctuance.  Wounds do not extend into the anus Musculoskeletal:        General: Normal range of motion.     Cervical back: Neck supple.  Skin:    General: Skin is warm and dry.  Neurological:     Mental Status: He is alert and oriented to person, place, and time.  Psychiatric:        Mood and Affect: Mood  normal.        Behavior: Behavior normal.     ED Results / Procedures / Treatments   Labs (all labs ordered are listed, but only abnormal results are displayed) Labs Reviewed - No data to display  EKG None  Radiology No results found.  Procedures Procedures    Medications Ordered in ED Medications  sulfamethoxazole-trimethoprim (BACTRIM DS) 800-160 MG per tablet 1 tablet (has no administration in time range)  methocarbamol (ROBAXIN) tablet 500 mg (has no administration in time range)    ED Course/ Medical Decision Making/ A&P                                 Medical Decision Making This patient presents to the ED for concern of buttock abscess, low back pain, this involves an extensive number of treatment options, and is a complaint that carries with it a high risk of complications and morbidity.  The differential diagnosis includes abscess, rectal fistula.  Differential diagnosis for low back pain includes muscle strain, cord compression syndrome, epidural abscess  My initial workup includes incision and drainage  Additional history obtained from: Nursing notes from this visit. Previous records within EMR system ED visits for similar on 03/15/2016, 08/07/2017, 10/17/2017, 12/27/2017 Mother at bedside  33 year old male presenting to the ED for evaluation of low back pain and abscess to right buttock.  He notes that he back pain 3 days ago.  He is most suspicious of muscular etiology.  No trauma.  Do not believe patient would benefit from low back imaging.  Lower suspicion for cord compression syndrome.  Will prescribe Robaxin.  He noticed the abscess to the right buttock 2 days ago.  He has an extensive history of abscesses.  On exam, there does appear to be an abscess with an area of fluctuance with overlying erythema.  Does not extend into the anus.  I had an extensive shared decision-making apposition with the patient regarding management.  I strongly encouraged incision and  drainage.  He adamantly declines stating he does not want to be cut again due to scarring issues and issues with healing.  He was educated that the abscess likely will not to go away until it is drained.  I also considered imaging to rule out rectal fistula.  He declines all labs and imaging as well.  He was informed that this is AGAINST MEDICAL ADVICE.  He does have capacity to make his medical decisions.  He is requesting antibiotics and states he will  apply warm compresses.  He states he will return if his symptoms worsen.  He was given very strict return precautions.  Note: Portions of this report may have been transcribed using voice recognition software. Every effort was made to ensure accuracy; however, inadvertent computerized transcription errors may still be present.        Final Clinical Impression(s) / ED Diagnoses Final diagnoses:  Acute bilateral low back pain without sciatica  Abscess of buttock, right    Rx / DC Orders ED Discharge Orders          Ordered    sulfamethoxazole-trimethoprim (BACTRIM DS) 800-160 MG tablet  2 times daily        04/15/23 1503    methocarbamol (ROBAXIN) 500 MG tablet  2 times daily        04/15/23 1503              Michelle Piper, Cordelia Poche 04/15/23 1503    Melene Plan, DO 04/15/23 1511

## 2023-04-15 NOTE — ED Triage Notes (Signed)
 States having lower back pain x 3 days and abscess upper buttocks x 2 days  states was draining  last night

## 2023-10-10 ENCOUNTER — Other Ambulatory Visit (HOSPITAL_BASED_OUTPATIENT_CLINIC_OR_DEPARTMENT_OTHER): Payer: Self-pay
